# Patient Record
Sex: Female | Born: 1980
Health system: Southern US, Community
[De-identification: ages and names within clinical notes are randomized; demographics above are authoritative.]

## PROBLEM LIST (undated history)

## (undated) HISTORY — PX: GUM SURGERY: SHX658

## (undated) HISTORY — PX: WISDOM TOOTH EXTRACTION: SHX21

---

## 2018-07-13 ENCOUNTER — Ambulatory Visit: Payer: Self-pay

## 2018-07-26 ENCOUNTER — Ambulatory Visit: Payer: Self-pay | Admitting: Adult Health

## 2018-07-26 ENCOUNTER — Encounter: Payer: Self-pay | Admitting: Adult Health

## 2018-07-26 VITALS — BP 116/76 | HR 82 | Temp 98.6°F | Resp 16 | Ht 61.0 in | Wt 146.0 lb

## 2018-07-26 DIAGNOSIS — R82998 Other abnormal findings in urine: Secondary | ICD-10-CM

## 2018-07-26 DIAGNOSIS — R399 Unspecified symptoms and signs involving the genitourinary system: Secondary | ICD-10-CM

## 2018-07-26 LAB — POCT URINALYSIS DIPSTICK
APPEARANCE: NORMAL
Bilirubin, UA: NEGATIVE
GLUCOSE UA: NEGATIVE
Ketones, UA: NEGATIVE
NITRITE UA: NEGATIVE
PROTEIN UA: NEGATIVE
Spec Grav, UA: 1.025 (ref 1.010–1.025)
Urobilinogen, UA: 1 E.U./dL
pH, UA: 7 (ref 5.0–8.0)

## 2018-07-26 MED ORDER — SULFAMETHOXAZOLE-TRIMETHOPRIM 800-160 MG PO TABS: 1.0000 | ORAL_TABLET | Freq: Two times a day (BID) | ORAL | 0 refills | Status: DC

## 2018-07-26 NOTE — Progress Notes (Signed)
Sent for urine culture.  Orders Placed This Encounter     sulfamethoxazole-trimethoprim (BACTRIM DS,SEPTRA DS) 800-160 MG tablet         Sig: Take 1 tablet by mouth 2 (two) times daily.         Dispense:  20 tablet         Refill:  0 On antibiotics as above.

## 2018-07-26 NOTE — Progress Notes (Addendum)
Subjective:     Patient ID: April Hoffman, female   DOB: 26-Sep-1981, 37 y.o.   MRN: [id number]   Blood pressure 116/76, pulse 82, temperature 98.6 F (37 C), resp. rate 16, height 5\' 1"  (1.549 m), weight 146 lb (66.2 kg), last menstrual period 07/26/2018, SpO2 99 %. Patient's last menstrual period was 07/26/2018.   Patient is a 37 year old female in no acute distress who comes to the clinic for complaints of urinary symptoms. She is currently on her menstrual cycle now.   Urinary Tract Infection   This is a new problem. The current episode started in the past 7 days (3 days ago symptoms started ). The problem occurs intermittently. The problem has been gradually worsening. The quality of the pain is described as burning. Pain scale: " just burning "  The pain is mild. There has been no fever. Fever duration: denies any fever. She is sexually active (with husband - denies any concerns / denies any vaginal symptoms. ). There is no history of pyelonephritis. Associated symptoms include frequency and hesitancy. Pertinent negatives include no chills, discharge, flank pain, hematuria, nausea, possible pregnancy, sweats, urgency or vomiting. She has tried nothing for the symptoms. The treatment provided no relief. There is no history of catheterization, kidney stones, recurrent UTIs, a single kidney, urinary stasis or a urological procedure.    Allergies  Allergen Reactions  . Coconut Oil     All coconut    Denies chronic history of Urinary tract infections; last UTI was over three years ago.  Denies any new sexual partners or vaginal symptoms. Denies any concerns for sexually transmitted disease.   Denies any medical problems and denies any prescription or over the counter medications.   Patient  denies any fever, body aches,chills, rash, chest pain, shortness of breath, nausea, vomiting, or diarrhea.   Review of Systems  Constitutional: Negative.  Negative for chills.  HENT: Negative.    Respiratory: Negative.   Cardiovascular: Negative.   Gastrointestinal: Negative.  Negative for nausea and vomiting.  Genitourinary: Positive for dysuria, frequency and hesitancy. Negative for decreased urine volume, difficulty urinating, dyspareunia, enuresis, flank pain, genital sores, hematuria, menstrual problem, pelvic pain, urgency, vaginal bleeding, vaginal discharge and vaginal pain.  Musculoskeletal: Negative.   Skin: Negative.   Neurological: Negative.   Hematological: Negative.   Psychiatric/Behavioral: Negative.       Objective:   Physical Exam  Constitutional: She is oriented to person, place, and time. She appears well-developed and well-nourished. No distress.  HENT:  Head: Normocephalic and atraumatic.  Eyes: Pupils are equal, round, and reactive to light. EOM are normal.  Neck: Normal range of motion. Neck supple.  Cardiovascular: Normal rate, regular rhythm, normal heart sounds and intact distal pulses. Exam reveals no gallop and no friction rub.  No murmur heard. Pulmonary/Chest: Effort normal and breath sounds normal. No stridor. No respiratory distress. She has no wheezes. She has no rales. She exhibits no tenderness.  Abdominal: Soft. Normal appearance, normal aorta and bowel sounds are normal. She exhibits no distension and no mass. There is no tenderness. There is no rebound, no guarding and no CVA tenderness. No hernia.  Musculoskeletal: Normal range of motion.  Neurological: She is alert and oriented to person, place, and time.  Skin: Skin is warm and dry. Capillary refill takes less than 2 seconds. She is not diaphoretic.  Psychiatric: She has a normal mood and affect. Her behavior is normal. Judgment and thought content normal.   Results for  orders placed or performed in visit on 07/26/18 (from the past 72 hour(s))  POCT urinalysis dipstick     Status: Abnormal   Collection Time: 07/26/18  3:28 PM  Result Value Ref Range   Color, UA yellow    Clarity, UA  clear    Glucose, UA Negative Negative   Bilirubin, UA negative    Ketones, UA negative    Spec Grav, UA 1.025 1.010 - 1.025   Blood, UA large    pH, UA 7.0 5.0 - 8.0   Protein, UA Negative Negative   Urobilinogen, UA 1.0 0.2 or 1.0 E.U./dL   Nitrite, UA negtive    Leukocytes, UA Small (1+) (A) Negative   Appearance normal    Odor none       Assessment:     Urinary tract infection symptoms - Plan: POCT urinalysis dipstick, CULTURE, URINE COMPREHENSIVE  Leukocytes in urine - Plan: CULTURE, URINE COMPREHENSIVE     Plan:     Meds ordered this encounter  Medications  . sulfamethoxazole-trimethoprim (BACTRIM DS,SEPTRA DS) 800-160 MG tablet    Sig: Take 1 tablet by mouth 2 (two) times daily.    Dispense:  20 tablet    Refill:  0   Return to clinic 3 to 5 days after completing antibiotics  sooner if symptoms worsen or persist or if fever or any other symptoms develop.   Advised patient call the office or your primary care doctor for an appointment if no improvement within 72 hours or if any symptoms change or worsen at any time  Advised ER or urgent Care if after hours or on weekend. Call 911 for emergency symptoms at any time.Patinet verbalized understanding of all instructions given/reviewed and treatment plan and has no further questions or concerns at this time.    Patient verbalized understanding of all instructions given and denies any further questions at this time.

## 2018-07-26 NOTE — Patient Instructions (Signed)

## 2018-07-28 ENCOUNTER — Telehealth: Payer: Self-pay | Admitting: Adult Health

## 2018-07-28 MED ORDER — AMOXICILLIN-POT CLAVULANATE 875-125 MG PO TABS: 1.0000 | ORAL_TABLET | Freq: Two times a day (BID) | ORAL | 0 refills | Status: DC

## 2018-07-28 NOTE — Telephone Encounter (Signed)
0830 am patient called to reports she took her first dose of Bactrim last night and about 15 minutes after taking it she developed vomiting that lasted for two hours. Vomited x 6 times then resolved last pm 07/27/18. She denied any hoarseness, throat swelling, rash or any lasting effects today. She still has urinary symptoms. She denies any new symptoms since office visit.  She reports she is urinating normally.  She is aware to have liiquid Benadryl on hand and take 50 mg if needed for allergy response.  Discussed signs  of allergy,  She reports she is doing fine and no symptoms this morning.  Will discontinue Bactrim and add to allergy list.   Medications Discontinued During This Encounter  Medication Reason  . sulfamethoxazole-trimethoprim (BACTRIM DS,SEPTRA DS) 800-160 MG tablet Allergic reaction   Meds ordered this encounter  Medications  . amoxicillin-clavulanate (AUGMENTIN) 875-125 MG tablet    Sig: Take 1 tablet by mouth 2 (two) times daily. Take with food- discontinue and urgent care/ER if any reaction    Dispense:  20 tablet    Refill:  0    Sulfur caused vomiting add to allergies She denies any penicillin allergies   Advised patient call the office or your primary care doctor for an appointment if no improvement within 72 hours or if any symptoms change or worsen at any time  Advised ER or urgent Care if after hours or on weekend. Call 911 for emergency symptoms at any time.Patinet verbalized understanding of all instructions given/reviewed and treatment plan and has no further questions or concerns at this time.    Patient verbalized understanding of all instructions given and denies any further questions at this time.

## 2018-07-29 LAB — CULTURE, URINE COMPREHENSIVE

## 2018-08-02 NOTE — Progress Notes (Signed)
French Ana please let her know that her urine culture showed non sufficient bacteria growth. If she has persistent symptoms she should follow up with her primary care MD.

## 2018-11-07 DIAGNOSIS — Z01419 Encounter for gynecological examination (general) (routine) without abnormal findings: Secondary | ICD-10-CM | POA: Diagnosis not present

## 2018-11-07 DIAGNOSIS — Z113 Encounter for screening for infections with a predominantly sexual mode of transmission: Secondary | ICD-10-CM | POA: Diagnosis not present

## 2018-11-07 DIAGNOSIS — Z01818 Encounter for other preprocedural examination: Secondary | ICD-10-CM | POA: Diagnosis not present

## 2018-11-20 DIAGNOSIS — Z01818 Encounter for other preprocedural examination: Secondary | ICD-10-CM | POA: Diagnosis not present

## 2018-11-20 DIAGNOSIS — R102 Pelvic and perineal pain: Secondary | ICD-10-CM | POA: Diagnosis not present

## 2018-11-20 DIAGNOSIS — Z30432 Encounter for removal of intrauterine contraceptive device: Secondary | ICD-10-CM | POA: Diagnosis not present

## 2018-11-20 DIAGNOSIS — Z113 Encounter for screening for infections with a predominantly sexual mode of transmission: Secondary | ICD-10-CM | POA: Diagnosis not present

## 2018-11-20 DIAGNOSIS — Z30017 Encounter for initial prescription of implantable subdermal contraceptive: Secondary | ICD-10-CM | POA: Diagnosis not present

## 2018-11-20 DIAGNOSIS — Z30430 Encounter for insertion of intrauterine contraceptive device: Secondary | ICD-10-CM | POA: Diagnosis not present

## 2018-12-04 ENCOUNTER — Ambulatory Visit: Payer: Self-pay | Admitting: Medical

## 2018-12-04 ENCOUNTER — Encounter: Payer: Self-pay | Admitting: Medical

## 2018-12-04 VITALS — BP 127/73 | HR 98 | Temp 99.6°F | Resp 18 | Wt 143.4 lb

## 2018-12-04 DIAGNOSIS — J0110 Acute frontal sinusitis, unspecified: Secondary | ICD-10-CM

## 2018-12-04 MED ORDER — AMOXICILLIN 875 MG PO TABS: 875.0000 mg | ORAL_TABLET | Freq: Two times a day (BID) | ORAL | 0 refills | Status: DC

## 2018-12-04 NOTE — Patient Instructions (Signed)

## 2018-12-04 NOTE — Progress Notes (Signed)
   Subjective:    Patient ID: April Hoffman, female    DOB: June 30, 1981, 38 y.o.   MRN: [id number]  HPI 38 yo female in non acute distress. Started last Wednesday with nasal discharge, and congestion. Denies fever or chills. Denies shortness of breath or chest pain. Ear pain left more than right. And pressure behind eyes, and with a sore throat.  Blood pressure 127/73, pulse 98, temperature 99.6 F (37.6 C), temperature source Tympanic, resp. rate 18, weight 143 lb 6.4 oz (65 kg), last menstrual period 11/08/2018, SpO2 98 %. Allergies  Allergen Reactions  . Coconut Oil     All coconut   . Sulfur     Vomiting after first dose      Review of Systems  Constitutional: Positive for appetite change (decreased). Negative for chills and fever.  HENT: Positive for congestion, ear pain (left > right), postnasal drip, rhinorrhea, sinus pressure (behind eyes, and forehead), sinus pain and sore throat. Negative for sneezing.   Eyes: Negative for discharge and itching.  Respiratory: Positive for cough. Negative for shortness of breath and wheezing.   Cardiovascular: Negative for chest pain.  Gastrointestinal: Positive for nausea. Negative for abdominal pain and diarrhea (nothing know).  Endocrine: Negative for polydipsia, polyphagia and polyuria.  Genitourinary: Negative for dysuria.  Musculoskeletal: Negative for myalgias.  Allergic/Immunologic: Positive for environmental allergies and food allergies (coconut).  Neurological: Positive for headaches.  Hematological: Positive for adenopathy (right axillary).  Psychiatric/Behavioral: Negative for behavioral problems, confusion, self-injury and suicidal ideas.       Objective:   Physical Exam Vitals signs and nursing note reviewed.  Constitutional:      Appearance: Normal appearance. She is normal weight.  HENT:     Head: Normocephalic and atraumatic.     Nose: Congestion present.     Mouth/Throat:     Mouth: Mucous membranes are moist.     Pharynx: Oropharynx is clear.  Eyes:     Extraocular Movements: Extraocular movements intact.     Pupils: Pupils are equal, round, and reactive to light.  Neurological:     Mental Status: She is alert.           Assessment & Plan:  Sinusitis  Meds ordered this encounter  Medications  . amoxicillin (AMOXIL) 875 MG tablet    Sig: Take 1 tablet (875 mg total) by mouth 2 (two) times daily.    Dispense:  20 tablet    Refill:  0  Rest , increase fluids, OTC Motrin or Tylenol for fever or pain.  Return in 3-5 days if not improving. Patient verbalizes understanding and has no questions at discharge.

## 2019-05-31 DIAGNOSIS — I959 Hypotension, unspecified: Secondary | ICD-10-CM | POA: Diagnosis not present

## 2019-05-31 DIAGNOSIS — R5383 Other fatigue: Secondary | ICD-10-CM | POA: Diagnosis not present

## 2019-05-31 DIAGNOSIS — E785 Hyperlipidemia, unspecified: Secondary | ICD-10-CM | POA: Diagnosis not present

## 2019-05-31 DIAGNOSIS — F419 Anxiety disorder, unspecified: Secondary | ICD-10-CM | POA: Diagnosis not present

## 2019-06-03 DIAGNOSIS — F411 Generalized anxiety disorder: Secondary | ICD-10-CM | POA: Diagnosis not present

## 2019-06-29 DIAGNOSIS — F419 Anxiety disorder, unspecified: Secondary | ICD-10-CM | POA: Diagnosis not present

## 2019-07-02 ENCOUNTER — Other Ambulatory Visit: Payer: Self-pay

## 2019-07-02 DIAGNOSIS — R5383 Other fatigue: Secondary | ICD-10-CM

## 2019-07-03 ENCOUNTER — Other Ambulatory Visit: Payer: Self-pay

## 2019-07-03 DIAGNOSIS — Z20822 Contact with and (suspected) exposure to covid-19: Secondary | ICD-10-CM

## 2019-07-03 DIAGNOSIS — R6889 Other general symptoms and signs: Secondary | ICD-10-CM | POA: Diagnosis not present

## 2019-07-04 ENCOUNTER — Ambulatory Visit: Payer: Self-pay

## 2019-07-04 LAB — NOVEL CORONAVIRUS, NAA: SARS-CoV-2, NAA: NOT DETECTED

## 2019-07-10 ENCOUNTER — Other Ambulatory Visit: Payer: Self-pay

## 2019-07-10 DIAGNOSIS — R5383 Other fatigue: Secondary | ICD-10-CM

## 2019-07-11 LAB — CBC
Hematocrit: 44.7 % (ref 34.0–46.6)
Hemoglobin: 15.2 g/dL (ref 11.1–15.9)
MCH: 31.4 pg (ref 26.6–33.0)
MCHC: 34 g/dL (ref 31.5–35.7)
MCV: 92 fL (ref 79–97)
Platelets: 292 10*3/uL (ref 150–450)
RBC: 4.84 x10E6/uL (ref 3.77–5.28)
RDW: 12.4 % (ref 11.7–15.4)
WBC: 6.8 10*3/uL (ref 3.4–10.8)

## 2019-07-11 LAB — COMPREHENSIVE METABOLIC PANEL
ALT: 13 IU/L (ref 0–32)
AST: 18 IU/L (ref 0–40)
Albumin/Globulin Ratio: 2 (ref 1.2–2.2)
Albumin: 4.8 g/dL (ref 3.8–4.8)
Alkaline Phosphatase: 64 IU/L (ref 39–117)
BUN/Creatinine Ratio: 8 — ABNORMAL LOW (ref 9–23)
BUN: 7 mg/dL (ref 6–20)
Bilirubin Total: 0.6 mg/dL (ref 0.0–1.2)
CO2: 20 mmol/L (ref 20–29)
Calcium: 9.2 mg/dL (ref 8.7–10.2)
Chloride: 105 mmol/L (ref 96–106)
Creatinine, Ser: 0.89 mg/dL (ref 0.57–1.00)
GFR calc Af Amer: 96 mL/min/{1.73_m2} (ref >59)
GFR calc non Af Amer: 83 mL/min/{1.73_m2} (ref >59)
Globulin, Total: 2.4 g/dL (ref 1.5–4.5)
Glucose: 86 mg/dL (ref 65–99)
Potassium: 4.3 mmol/L (ref 3.5–5.2)
Sodium: 139 mmol/L (ref 134–144)
Total Protein: 7.2 g/dL (ref 6.0–8.5)

## 2019-07-11 LAB — TSH: TSH: 1.63 u[IU]/mL (ref 0.450–4.500)

## 2019-07-13 DIAGNOSIS — Z0000 Encounter for general adult medical examination without abnormal findings: Secondary | ICD-10-CM | POA: Diagnosis not present

## 2019-08-03 DIAGNOSIS — F419 Anxiety disorder, unspecified: Secondary | ICD-10-CM | POA: Diagnosis not present

## 2019-08-21 ENCOUNTER — Other Ambulatory Visit: Payer: Self-pay

## 2019-08-21 ENCOUNTER — Ambulatory Visit (INDEPENDENT_AMBULATORY_CARE_PROVIDER_SITE_OTHER): Payer: BC Managed Care – PPO | Admitting: Psychiatry

## 2019-08-21 ENCOUNTER — Encounter: Payer: Self-pay | Admitting: Psychiatry

## 2019-08-21 DIAGNOSIS — F320 Major depressive disorder, single episode, mild: Secondary | ICD-10-CM

## 2019-08-21 NOTE — Progress Notes (Signed)
Patient at the visit stated she was not interested in medication management and was looking for a psychotherapist.

## 2019-08-22 ENCOUNTER — Ambulatory Visit (HOSPITAL_COMMUNITY): Payer: BC Managed Care – PPO | Admitting: Licensed Clinical Social Worker

## 2019-08-22 DIAGNOSIS — F320 Major depressive disorder, single episode, mild: Secondary | ICD-10-CM | POA: Insufficient documentation

## 2019-08-31 DIAGNOSIS — K219 Gastro-esophageal reflux disease without esophagitis: Secondary | ICD-10-CM | POA: Diagnosis not present

## 2019-08-31 DIAGNOSIS — F419 Anxiety disorder, unspecified: Secondary | ICD-10-CM | POA: Diagnosis not present

## 2020-01-04 ENCOUNTER — Ambulatory Visit: Payer: Self-pay

## 2020-03-07 ENCOUNTER — Other Ambulatory Visit: Payer: Self-pay | Admitting: *Deleted

## 2020-03-07 MED ORDER — HYDROXYZINE HCL 25 MG PO TABS: 25.0000 mg | ORAL_TABLET | Freq: Three times a day (TID) | ORAL | 1 refills | Status: DC

## 2020-03-11 ENCOUNTER — Ambulatory Visit: Payer: BC Managed Care – PPO

## 2020-03-11 ENCOUNTER — Other Ambulatory Visit: Payer: Self-pay

## 2020-03-11 NOTE — Progress Notes (Signed)
Nutrition Consult 03/11/20  CC: Having difficulty continuing to lose weight using her current regimen.  HX: HT: 5'1'' WT: 138 lbs. Gives hx of weight gain to 145 lb during the COVID -19 lock down.  Notes that she feels most comfortable at 128 lbs. She placed herself on a 1200 calorie diet and lost to 139 lbs.  It has taken her 4 weeks to lose 1 lb. She is maintaining her diet and is frustrated with the lack of progress. Follows a Lacto-Ovo vegetarian diet pattern.  Keeping a dietary journal.  24 hr recall:   7:30 am Breakfast: Special K with Almond Milk 150 cal.  Coffee (1-2 cups with 1/4c. 2% milk and Stevia. 60 cal.  12:00 lunch:  Annie's Lentil/vegetable soup and drinks water. 270 cal.  Snack afternoon: apple or banana or both    160-200 calories.  5:30 Dinner: Often vegetable and bean soup with water to drink. 270-300 cal.  Snacks at night: Skinny popcorn with/or without snap pea snacks. Suanne Minahan also have piece of fruit to get to the 1200 calories for the day. Always has a herbal tea with Stevia.  Exercise/Activity: Gardens for 4 hours on weekend.  Rawleigh Rode go for hike on weekend.  Walking and yoga are limited/rare.  Works full [employer].  Job includes increased stress.  Recommendations: Add another 150 to 200 calories into the diet for next 3-4 weeks.Increase the calories at lunch time.  Try using a leafy green salad with healthy fat dressing. Add some cheese such as part-skim milk moaarella cheese or other low fat soft cheese or protein/fat in form of roasted nuts.  Try to incorporate physical activity into your daily routine in form of short yoga routine or a short brisk walk on as many days as possible each week.  Current goal and long term goal: To get my weight down to or close to 128 lbs.  Maintain my health with a good energy level.    Plan:  Try increasing my  calories toby 150 -200 per day.Walk on campus daily.   Call [facility] and arrange for a 15 -30 minute phone call for a  weight check in June.  Maggie Anajulia Leyendecker, RN, RD, LDN

## 2020-03-14 DIAGNOSIS — D229 Melanocytic nevi, unspecified: Secondary | ICD-10-CM | POA: Diagnosis not present

## 2020-03-14 DIAGNOSIS — L301 Dyshidrosis [pompholyx]: Secondary | ICD-10-CM | POA: Diagnosis not present

## 2020-03-14 DIAGNOSIS — L719 Rosacea, unspecified: Secondary | ICD-10-CM | POA: Diagnosis not present

## 2020-05-24 DIAGNOSIS — Z20822 Contact with and (suspected) exposure to covid-19: Secondary | ICD-10-CM | POA: Diagnosis not present

## 2020-07-17 ENCOUNTER — Other Ambulatory Visit: Payer: Self-pay

## 2020-07-17 ENCOUNTER — Telehealth: Payer: BC Managed Care – PPO | Admitting: Nurse Practitioner

## 2020-07-17 ENCOUNTER — Ambulatory Visit: Payer: BC Managed Care – PPO

## 2020-07-17 DIAGNOSIS — R059 Cough, unspecified: Secondary | ICD-10-CM

## 2020-07-17 DIAGNOSIS — Z20822 Contact with and (suspected) exposure to covid-19: Secondary | ICD-10-CM

## 2020-07-17 LAB — POC COVID19 BINAXNOW: SARS Coronavirus 2 Ag: NEGATIVE

## 2020-07-17 NOTE — Progress Notes (Signed)
   Subjective:    Patient ID: April Hoffman, female    DOB: 11-Jul-1981, 39 y.o.   MRN: [id number]  HPI 39 year old female that has been feeling run down for a week and started coughing yesterday, denies a known exposure to COVID. Denies a known fever, she was fully vaccinated in April for COVID.   She had asthma as a child and has grown out of that and has not used an inhaler since childhood.   She has felt some pressure with her cough, also noted she has been very stressed and burned out with everything going on right now.   She has not taken anything OTC as of now does take some Fish oil daily.    Review of Systems  Constitutional: Negative.   Respiratory: Positive for cough.   Psychiatric/Behavioral: The patient is nervous/anxious.        Objective:   Physical Exam        Assessment & Plan:  This was a telehealth appointment with the patient after a Rapid Covid was performed in the parking lot by RN. Rapid COVID was negative. Will send PCR for confirmation  Discussed stress management with patient encouraged counseling through [facility]  Recommended daily Vitamin C and D while ill  Will follow up with PCR RTC as needed if symptoms persist or with new concerns   May use Mucinex for cough support

## 2020-07-19 LAB — NOVEL CORONAVIRUS, NAA: SARS-CoV-2, NAA: NOT DETECTED

## 2020-07-19 LAB — SARS-COV-2, NAA 2 DAY TAT

## 2020-09-16 ENCOUNTER — Other Ambulatory Visit: Payer: Self-pay

## 2020-09-16 ENCOUNTER — Ambulatory Visit: Payer: BC Managed Care – PPO

## 2020-09-16 ENCOUNTER — Telehealth: Payer: BC Managed Care – PPO | Admitting: Registered Nurse

## 2020-09-16 ENCOUNTER — Telehealth: Payer: Self-pay | Admitting: Registered Nurse

## 2020-09-16 ENCOUNTER — Encounter: Payer: Self-pay | Admitting: Registered Nurse

## 2020-09-16 DIAGNOSIS — N390 Urinary tract infection, site not specified: Secondary | ICD-10-CM

## 2020-09-16 DIAGNOSIS — R350 Frequency of micturition: Secondary | ICD-10-CM

## 2020-09-16 LAB — POCT URINALYSIS DIPSTICK
Blood, UA: NEGATIVE
Glucose, UA: NEGATIVE
Ketones, UA: NEGATIVE
Leukocytes, UA: NEGATIVE
Nitrite, UA: NEGATIVE
Protein, UA: NEGATIVE
Spec Grav, UA: 1.02 (ref 1.010–1.025)
Urobilinogen, UA: NEGATIVE E.U./dL — AB
pH, UA: 6 (ref 5.0–8.0)

## 2020-09-16 NOTE — Progress Notes (Signed)
Pt presents with c/o feeling like she had a UTI; c/o  Pain with urination and cloudy urine; c/o left sided mid back pain today;  denies any blood; states she always has frequency and urgency; states she has a h/o UTI's; wt: 147 pounds; allergic to Sulfa drugs

## 2020-09-16 NOTE — Telephone Encounter (Signed)
Handouts sent to patient mychart account s/p telehealth visit

## 2020-09-16 NOTE — Progress Notes (Signed)
Subjective:    Patient ID: April Hoffman, female    DOB: [birth year], 39 y.o.   MRN: [id number]  39y/o established female patient presents with c/o feeling like she had a UTI; c/o  Pain with urination and cloudy urine; c/o left sided mid back pain today when first woke up then resolved as day went on and she had po intake;  denies any blood; states she always has frequency and urgency; states she has a h/o UTI's; wt: 147 pounds; allergic to Sulfa drugs did urinalysis with RN Romeo Apple in clinic this morning.  Patient consented to telephone visit.  This visit was conducted entirely via telephone/audio only.  I spent 19 minutes on the telephone with patient.  Patient reported was told years ago she has IBS.  Patient reports diarrhea typically starts after stressors/anxiety.  Patient has not tried fodmap diet.  Typically daily episode of diarrhea.  Patient reported at work doesn't typically drink a lot of water sips two 16oz coffees during her work shift.  Patient reported voiding every 30 minutes to hour typically for frequency.  Sometimes am void cloudy but denied smell or color changes typically darker yellow.       Review of Systems  Constitutional: Negative for activity change, appetite change, chills, diaphoresis, fatigue and fever.  HENT: Negative for trouble swallowing and voice change.   Eyes: Negative for photophobia and visual disturbance.  Respiratory: Negative for cough, shortness of breath, wheezing and stridor.   Cardiovascular: Negative for leg swelling.  Gastrointestinal: Positive for diarrhea. Negative for nausea and vomiting.  Endocrine: Negative for cold intolerance and heat intolerance.  Genitourinary: Positive for frequency and urgency. Negative for difficulty urinating and menstrual problem.  Musculoskeletal: Negative for arthralgias, back pain, gait problem and myalgias.  Skin: Negative for rash.  Allergic/Immunologic: Positive for environmental allergies and food allergies.   Neurological: Negative for dizziness, tremors, syncope, weakness, light-headedness and headaches.  Hematological: Negative for adenopathy. Does not bruise/bleed easily.  Psychiatric/Behavioral: Negative for agitation, confusion and sleep disturbance.       Objective:   Physical Exam Nursing note reviewed.  Constitutional:      General: She is awake. She is not in acute distress. HENT:     Right Ear: Hearing normal.     Left Ear: Hearing normal.     Nose: Nose normal. No congestion or rhinorrhea.     Mouth/Throat:     Pharynx: Oropharynx is clear.  Pulmonary:     Effort: Pulmonary effort is normal.     Breath sounds: Normal breath sounds and air entry. No wheezing.  Neurological:     Mental Status: She is alert and oriented to person, place, and time. Mental status is at baseline.     GCS: GCS verbal subscore is 5.  Psychiatric:        Attention and Perception: Attention and perception normal.        Mood and Affect: Mood normal.        Speech: Speech normal.        Behavior: Behavior normal. Behavior is cooperative.        Thought Content: Thought content normal.        Cognition and Memory: Cognition and memory normal.        Judgment: Judgment normal.       Discussed urinalysis results with patient and pending urine culture results will call or send my chart message once available typically 24-72hours.  Patient verbalized understanding information/instructions, agreed with plan of care  and had no further questions at this time.    Assessment & Plan:  A-urinary frequency, diarrhea  P-.Discussed hormonal changes thin tissues as age mechanical friction/wiping/clothes rubbing/sexual intercourse could exacerbate urinary symptoms.  Urine not dilute today increase water/noncaffeinated fluid intake.  Consider 1 cup per hour x 8 hours per day to keep urine pale yellow and clear.  Medications as directed. Patient is also to push fluids and may use Pyridium (Azo) OTC 50mg  po prn per  manufacturer instructions x 3 days.  Urine culture sent to [facility] today typically 24-72 hours for results.   Hydrate, avoid dehydration. Avoid holding urine void on frequent basis every 4 to 6 hours. If unable to void every 8 hours follow up for re-evaluation with PCM, urgent care or ER. Call or return to clinic as needed if these symptoms worsen or fail to improve as anticipated. Exitcare handout on urinary frequency, interstitial cystitis and foods that can irritate bladder from [facility] sent to patient mychart account.  Decrease caffeine intake (currently 32oz consider making weaker brew or cutting back other intake if cannot tolerate weaker formula).  Discussed no infection seen on urinalysis dipstick at [facility] today.    Patient verbalized agreement and understanding of treatment plan and had no further questions at this time.  P2: Hydrate and cranberry juice   Discussed fodmap diet.  Exitcare handouts on IBS/IBS diet posted to patient mychart and to follow up with PCM consider GI appt.  Consider keeping food and symptom log.  Patient verbalized understanding information/instructions, agreed with plan of care and had no further questions at this time.

## 2020-09-16 NOTE — Patient Instructions (Addendum)
Consider azo (pyridium) 50mg  by mouth over the counter per manufacturer instructions (Max 100mg  three times a day for 3 days) Will change color or urine to orange/bright gold and urine can stain clothes if splashes Will call with urine culture results once available typically 2-3 days ER/Urgent Care same day if unable to void every 8 hours, seeing red blood or clots in pee and not on menstrual cycle or brown/tea/coffee colored urine even if increase water intake, worsening flank/abdomen pain, fever or chills.   Do certain foods irritate the bladder? The bladder collects waste, including remainders of foods and drinks. If you have a bladder condition, such as IC, a variety of foods can irritate your bladder. Both common and unusual foods may cause irritation:  All alcoholic beverages, including champagne. Apples. Apple juice. Bananas. Beer. Brewer's yeast. Canned figs. Cantaloupes. Carbonated drinks. Cheese. Chicken livers. Chilies/spicy foods. Chocolate. Citrus fruits. Coffee. Corned beef. Cranberries. Fava beans. Grapes. Guava. Lemon juice. Lima beans. Nuts -- hazelnuts (also called filberts), pecans and pistachios Mayonnaise. NutraSweet.T Onions (raw). Peaches. Pickled herring. Pineapple. Plums. Prunes. Raisins. Rye bread. Saccharin. Sour cream. Soy sauce. Strawberries. Tea -- black or green, regular or decaffeinated, and herbal blends that contain black or green tea. Tomatoes. Vinegar. Vitamins buffered with aspartame. Yogurt. How are bladder-irritating foods identified? Determining if a food irritates your bladder is a process of elimination. Not all people sensitive to bladder irritants are affected by the same foods. Your healthcare provider can help you identify bladder-irritating foods.  To test bladder discomfort by eliminating foods, you can:  Keep a food diary to track foods that are and aren't irritating. Remove the foods listed above from your  diet for a few days. Once your symptoms are gone, you can begin to add foods in. Start with a small amount of one food, increasing the portion size over several days. If irritation returns after reintroducing a food, stop eating it completely. Repeat food reintroduction slowly to identify your bladder-irritating foods. Lab tests cannot diagnose foods that cause bladder irritation. But a urologist (healthcare specialist who treats urinary system problems) may examine your bladder to diagnose or rule out IC.  How can I manage bladder irritation? You can manage discomfort by avoiding foods you have identified as bladder irritants. But removing foods from your diet doesn't mean you can never have them again. You might be able to enjoy them in moderation (once in a while). Drinking plenty of water will help reduce pain from any bladder-irritating foods you might ingest, in moderation or accidentally.  Can I prevent bladder irritation from foods? You cannot always avoid bladder discomfort. But identifying foods that cause bladder pain can go a long way to helping you feel better. Through a process of elimination and careful diet, you can find and avoid bothersome foods and drinks.  What is the outlook for people sensitive to bladder irritation? If foods irritate your bladder, you may worry about finding enough to eat. SOME people with IC are able to eat and drink these foods:  Alcohol or wines (only as flavoring). Almonds. Apple juice. Blueberries. Coffee (acid-free kava) or highly roasted. Extracts (brandy, rum, etc.). Imitation sour cream. Lentils. Nuts -- almonds, cashews and peanuts. Onions (cooked). Orange juice (reduced acid). Pears. Processed cheese (non-aged). Shallots. Spring water. Strawberries (1/2 cup). Sun tea (herbal, but no blends made with black or green tea). Tomatoes (low acid). White chocolate. Wines (late harvest). Zest of orange or limes. Other foods not listed. (It  is best to  check with your provider.) What can I do if I have bladder pain from foods? Living with bladder irritation can be uncomfortable. But you can take steps to remove irritants from your diet and reduce pain. Avoid foods that irritate your bladder, and remember that water is important. Drinking enough water helps you feel more comfortable after you eat foods that irritate your bladder.   Interstitial Cystitis  Interstitial cystitis is inflammation of the bladder. This may cause pain in the bladder area as well as a frequent and urgent need to urinate. The bladder is a hollow organ in the lower part of the abdomen. It stores urine after the urine is made in the kidneys. The severity of interstitial cystitis can vary from person to person. You may have flare-ups, and then your symptoms may go away for a while. For many people, it becomes a long-term (chronic) problem. What are the causes? The cause of this condition is not known. What increases the risk? The following factors may make you more likely to develop this condition:  You are female.  You have fibromyalgia.  You have irritable bowel syndrome (IBS).  You have endometriosis. This condition may be aggravated by:  Stress.  Smoking.  Spicy foods. What are the signs or symptoms? Symptoms of interstitial cystitis vary, and they can change over time. Symptoms may include:  Discomfort or pain in the bladder area, which is in the lower abdomen. Pain can range from mild to severe. The pain may change in intensity as the bladder fills with urine or as it empties.  Pain in the pelvic area, between the hip bones.  An urgent need to urinate.  Frequent urination.  Pain during urination.  Pain during sex.  Blood in the urine. For women, symptoms often get worse during menstruation. How is this diagnosed? This condition is diagnosed based on your symptoms, your medical history, and a physical exam. You may have tests to rule  out other conditions, such as:  Urine tests.  Cystoscopy. For this test, a tool similar to a very thin telescope is used to look into your bladder.  Biopsy. This involves taking a sample of tissue from the bladder to be examined under a microscope. How is this treated? There is no cure for this condition, but treatment can help you control your symptoms. Work closely with your health care provider to find the most effective treatments for you. Treatment options may include:  Medicines to relieve pain and reduce how often you feel the need to urinate.  Learning ways to control when you urinate (bladder training).  Lifestyle changes, such as changing your diet or taking steps to control stress.  Using a device that provides electrical stimulation to your nerves, which can relieve pain (neuromodulation therapy). The device is placed on your back, where it blocks the nerves that cause you to feel pain in your bladder area.  A procedure that stretches your bladder by filling it with air or fluid.  Surgery. This is rare. It is only done for extreme cases, if other treatments do not help. Follow these instructions at home: Bladder training   Use bladder training techniques as directed. Techniques may include: ? Urinating at scheduled times. ? Training yourself to delay urination. ? Doing exercises (Kegel exercises) to strengthen the muscles that control urine flow.  Keep a bladder diary. ? Write down the times that you urinate and any symptoms that you have. This can help you find out which foods, liquids, or  activities make your symptoms worse. ? Use your bladder diary to schedule bathroom trips. If you are away from home, plan to be near a bathroom at each of your scheduled times.  Make sure that you urinate just before you leave the house and just before you go to bed. Eating and drinking  Make dietary changes as recommended by your health care provider. You may need to  avoid: ? Spicy foods. ? Foods that contain a lot of potassium.  Limit your intake of beverages that make you need to urinate. These include: ? Caffeinated beverages like soda, coffee, and tea. ? Alcohol. General instructions  Take over-the-counter and prescription medicines only as told by your health care provider.  Do not drink alcohol.  You can try a warm or cool compress over your bladder for comfort.  Avoid wearing tight clothing.  Do not use any products that contain nicotine or tobacco, such as cigarettes and e-cigarettes. If you need help quitting, ask your health care provider.  Keep all follow-up visits as told by your health care provider. This is important. Contact a health care provider if you have:  Symptoms that do not get better with treatment.  Pain or discomfort that gets worse.  More frequent urges to urinate.  A fever. Get help right away if:  You have no control over when you urinate. Summary  Interstitial cystitis is inflammation of the bladder.  This condition may cause pain in the bladder area as well as a frequent and urgent need to urinate.  You may have flare-ups of the condition, and then it may go away for a while. For many people, it becomes a long-term (chronic) problem.  There is no cure for interstitial cystitis, but treatment methods are available to control your symptoms. This information is not intended to replace advice given to you by your health care provider. Make sure you discuss any questions you have with your health care provider. Document Revised: 09/23/2017 Document Reviewed: 09/05/2017 Elsevier Patient Education  2020 [employer]   Low-FODMAP Eating Plan  FODMAPs (fermentable oligosaccharides, disaccharides, monosaccharides, and polyols) are sugars that are hard for some people to digest. A low-FODMAP eating plan may help some people who have bowel (intestinal) diseases to manage their symptoms. This meal plan can be  complicated to follow. Work with a diet and nutrition specialist (dietitian) to make a low-FODMAP eating plan that is right for you. A dietitian can make sure that you get enough nutrition from this diet. What are tips for following this plan? Reading food labels Check labels for hidden FODMAPs such as: High-fructose syrup. Honey. Agave. Natural fruit flavors. Onion or garlic powder. Choose low-FODMAP foods that contain 3-4 grams of fiber per serving. Check food labels for serving sizes. Eat only one serving at a time to make sure FODMAP levels stay low. Meal planning Follow a low-FODMAP eating plan for up to 6 weeks, or as told by your health care provider or dietitian. To follow the eating plan: Eliminate high-FODMAP foods from your diet completely. Gradually reintroduce high-FODMAP foods into your diet one at a time. Most people should wait a few days after introducing one high-FODMAP food before they introduce the next high-FODMAP food. Your dietitian can recommend how quickly you may reintroduce foods. Keep a daily record of what you eat and drink, and make note of any symptoms that you have after eating. Review your daily record with a dietitian regularly. Your dietitian can help you identify which foods you  can eat and which foods you should avoid. General tips Drink enough fluid each day to keep your urine pale yellow. Avoid processed foods. These often have added sugar and may be high in FODMAPs. Avoid most dairy products, whole grains, and sweeteners. Work with a dietitian to make sure you get enough fiber in your diet. Recommended foods Grains Gluten-free grains, such as rice, oats, buckwheat, quinoa, corn, polenta, and millet. Gluten-free pasta, bread, or cereal. Rice noodles. Corn tortillas. Vegetables Eggplant, zucchini, cucumber, peppers, green beans, Brussels sprouts, bean sprouts, lettuce, arugula, kale, Swiss chard, spinach, collard greens, bok choy, summer squash,  potato, and tomato. Limited amounts of corn, carrot, and sweet potato. Green parts of scallions. Fruits Bananas, oranges, lemons, limes, blueberries, raspberries, strawberries, grapes, cantaloupe, honeydew melon, kiwi, papaya, passion fruit, and pineapple. Limited amounts of dried cranberries, banana chips, and shredded coconut. Dairy Lactose-free milk, yogurt, and kefir. Lactose-free cottage cheese and ice cream. Non-dairy milks, such as almond, coconut, hemp, and rice milk. Yogurts made of non-dairy milks. Limited amounts of goat cheese, brie, mozzarella, parmesan, swiss, and other hard cheeses. Meats and other protein foods Unseasoned beef, pork, poultry, or fish. Eggs. Tomasa Blase. Tofu (firm) and tempeh. Limited amounts of nuts and seeds, such as almonds, walnuts, [place] nuts, pecans, peanuts, pumpkin seeds, chia seeds, and sunflower seeds. Fats and oils Butter-free spreads. Vegetable oils, such as olive, canola, and sunflower oil. Seasoning and other foods Artificial sweeteners with names that do not end in "ol" such as aspartame, saccharine, and stevia. Maple syrup, white table sugar, raw sugar, brown sugar, and molasses. Fresh basil, coriander, parsley, rosemary, and thyme. Beverages Water and mineral water. Sugar-sweetened soft drinks. Small amounts of orange juice or cranberry juice. Black and green tea. Most dry wines. Coffee. This may not be a complete list of low-FODMAP foods. Talk with your dietitian for more information. Foods to avoid Grains Wheat, including kamut, durum, and semolina. Barley and bulgur. Couscous. Wheat-based cereals. Wheat noodles, bread, crackers, and pastries. Vegetables Chicory root, artichoke, asparagus, cabbage, snow peas, sugar snap peas, mushrooms, and cauliflower. Onions, garlic, leeks, and the white part of scallions. Fruits Fresh, dried, and juiced forms of apple, pear, watermelon, peach, plum, cherries, apricots, blackberries, boysenberries, figs,  nectarines, and mango. Avocado. Dairy Milk, yogurt, ice cream, and soft cheese. Cream and sour cream. Milk-based sauces. Custard. Meats and other protein foods Fried or fatty meat. Sausage. Cashews and pistachios. Soybeans, baked beans, black beans, chickpeas, kidney beans, fava beans, navy beans, lentils, and split peas. Seasoning and other foods Any sugar-free gum or candy. Foods that contain artificial sweeteners such as sorbitol, mannitol, isomalt, or xylitol. Foods that contain honey, high-fructose corn syrup, or agave. Bouillon, vegetable stock, beef stock, and chicken stock. Garlic and onion powder. Condiments made with onion, such as hummus, chutney, pickles, relish, salad dressing, and salsa. Tomato paste. Beverages Chicory-based drinks. Coffee substitutes. Chamomile tea. Fennel tea. Sweet or fortified wines such as port or sherry. Diet soft drinks made with isomalt, mannitol, maltitol, sorbitol, or xylitol. Apple, pear, and mango juice. Juices with high-fructose corn syrup. This may not be a complete list of high-FODMAP foods. Talk with your dietitian to discuss what dietary choices are best for you.  Summary A low-FODMAP eating plan is a short-term diet that eliminates FODMAPs from your diet to help ease symptoms of certain bowel diseases. The eating plan usually lasts up to 6 weeks. After that, high-FODMAP foods are restarted gradually, one at a time, so you can find out which  may be causing symptoms. A low-FODMAP eating plan can be complicated. It is best to work with a dietitian who has experience with this type of plan. This information is not intended to replace advice given to you by your health care provider. Make sure you discuss any questions you have with your health care provider. Document Revised: 09/23/2017 Document Reviewed: 06/07/2017 Elsevier Patient Education  2020 [employer] Inc[employer] Diet for Irritable Bowel Syndrome When you have irritable bowel syndrome (IBS), it is very  important to eat the foods and follow the eating habits that are best for your condition. IBS may cause various symptoms such as pain in the abdomen, constipation, or diarrhea. Choosing the right foods can help to ease the discomfort from these symptoms. Work with your health care provider and diet and nutrition specialist (dietitian) to find the eating plan that will help to control your symptoms. What are tips for following this plan?      Keep a food diary. This will help you identify foods that cause symptoms. Write down: ? What you eat and when you eat it. ? What symptoms you have. ? When symptoms occur in relation to your meals, such as "pain in abdomen 2 hours after dinner."  Eat your meals slowly and in a relaxed setting.  Aim to eat 5-6 small meals per day. Do not skip meals.  Drink enough fluid to keep your urine pale yellow.  Ask your health care provider if you should take an over-the-counter probiotic to help restore healthy bacteria in your gut (digestive tract). ? Probiotics are foods that contain good bacteria and yeasts.  Your dietitian may have specific dietary recommendations for you based on your symptoms. He or she may recommend that you: ? Avoid foods that cause symptoms. Talk with your dietitian about other ways to get the same nutrients that are in those problem foods. ? Avoid foods with gluten. Gluten is a protein that is found in rye, wheat, and barley. ? Eat more foods that contain soluble fiber. Examples of foods with high soluble fiber include oats, seeds, and certain fruits and vegetables. Take a fiber supplement if directed by your dietitian. ? Reduce or avoid certain foods called FODMAPs. These are foods that contain carbohydrates that are hard to digest. Ask your doctor which foods contain these carbohydrates. What foods are not recommended? The following are some foods and drinks that may make your symptoms worse:  Fatty foods, such as french  fries.  Foods that contain gluten, such as pasta and cereal.  Dairy products, such as milk, cheese, and ice cream.  Chocolate.  Alcohol.  Products with caffeine, such as coffee.  Carbonated drinks, such as soda.  Foods that are high in FODMAPs. These include certain fruits and vegetables.  Products with sweeteners such as honey, high fructose corn syrup, sorbitol, and mannitol. The items listed above may not be a complete list of foods and beverages you should avoid. Contact a dietitian for more information. What foods are good sources of fiber? Your health care provider or dietitian may recommend that you eat more foods that contain fiber. Fiber can help to reduce constipation and other IBS symptoms. Add foods with fiber to your diet a little at a time so your body can get used to them. Too much fiber at one time might cause gas and swelling of your abdomen. The following are some foods that are good sources of fiber:  Berries, such as raspberries, strawberries, and blueberries.  Tomatoes.  Carrots.  Brown rice.  Oats.  Seeds, such as chia and pumpkin seeds. The items listed above may not be a complete list of recommended sources of fiber. Contact your dietitian for more options. Where to find more information  International Foundation for Functional Gastrointestinal Disorders: www.iffgd.[employer] of Diabetes and Digestive and Kidney Diseases: CarFli[employer].tn Summary  When you have irritable bowel syndrome (IBS), it is very important to eat the foods and follow the eating habits that are best for your condition.  IBS may cause various symptoms such as pain in the abdomen, constipation, or diarrhea.  Choosing the right foods can help to ease the discomfort that comes from symptoms.  Keep a food diary. This will help you identify foods that cause symptoms.  Your health care provider or diet and nutrition specialist (dietitian) may recommend that you eat  more foods that contain fiber. This information is not intended to replace advice given to you by your health care provider. Make sure you discuss any questions you have with your health care provider. Document Revised: 01/31/2019 Document Reviewed: 06/14/2017 Elsevier Patient Education  2020 Else[employer] Inc. Irritable Bowel Syndrome, Adult  Irritable bowel syndrome (IBS) is a group of symptoms that affects the organs responsible for digestion (gastrointestinal or GI tract). IBS is not one specific disease. To regulate how the GI tract works, the body sends signals back and forth between the intestines and the brain. If you have IBS, there may be a problem with these signals. As a result, the GI tract does not function normally. The intestines may become more sensitive and overreact to certain things. This may be especially true when you eat certain foods or when you are under stress. There are four types of IBS. These may be determined based on the consistency of your stool (feces):  IBS with diarrhea.  IBS with constipation.  Mixed IBS.  Unsubtyped IBS. It is important to know which type of IBS you have. Certain treatments are more likely to be helpful for certain types of IBS. What are the causes? The exact cause of IBS is not known. What increases the risk? You may have a higher risk for IBS if you:  Are female.  Are younger than 54.  Have a family history of IBS.  Have a mental health condition, such as depression, anxiety, or post-traumatic stress disorder.  Have had a bacterial infection of your GI tract. What are the signs or symptoms? Symptoms of IBS vary from person to person. The main symptom is abdominal pain or discomfort. Other symptoms usually include one or more of the following:  Diarrhea, constipation, or both.  Abdominal swelling or bloating.  Feeling full after eating a small or regular-sized meal.  Frequent gas.  Mucus in the stool.  A feeling of having  more stool left after a bowel movement. Symptoms tend to come and go. They may be triggered by stress, mental health conditions, or certain foods. How is this diagnosed? This condition may be diagnosed based on a physical exam, your medical history, and your symptoms. You may have tests, such as:  Blood tests.  Stool test.  X-rays.  CT scan.  Colonoscopy. This is a procedure in which your GI tract is viewed with a long, thin, flexible tube. How is this treated? There is no cure for IBS, but treatment can help relieve symptoms. Treatment depends on the type of IBS you have, and may include:  Changes to your diet, such as: ? Avoiding  foods that cause symptoms. ? Drinking more water. ? Following a low-FODMAP (fermentable oligosaccharides, disaccharides, monosaccharides, and polyols) diet for up to 6 weeks, or as told by your health care provider. FODMAPs are sugars that are hard for some people to digest. ? Eating more fiber. ? Eating medium-sized meals at the same times every day.  Medicines. These may include: ? Fiber supplements, if you have constipation. ? Medicine to control diarrhea (antidiarrheal medicines). ? Medicine to help control muscle tightening (spasms) in your GI tract (antispasmodic medicines). ? Medicines to help with mental health conditions, such as antidepressants or tranquilizers.  Talk therapy or counseling.  Working with a diet and nutrition specialist (dietitian) to help create a food plan that is right for you.  Managing your stress. Follow these instructions at home: Eating and drinking  Eat a healthy diet.  Eat medium-sized meals at about the same time every day. Do not eat large meals.  Gradually eat more fiber-rich foods. These include whole grains, fruits, and vegetables. This may be especially helpful if you have IBS with constipation.  Eat a diet low in FODMAPs.  Drink enough fluid to keep your urine pale yellow.  Keep a journal of foods  that seem to trigger symptoms.  Avoid foods and drinks that: ? Contain added sugar. ? Make your symptoms worse. Dairy products, caffeinated drinks, and carbonated drinks can make symptoms worse for some people. General instructions  Take over-the-counter and prescription medicines and supplements only as told by your health care provider.  Get enough exercise. Do at least 150 minutes of moderate-intensity exercise each week.  Manage your stress. Getting enough sleep and exercise can help you manage stress.  Keep all follow-up visits as told by your health care provider and therapist. This is important. Alcohol Use  Do not drink alcohol if: ? Your health care provider tells you not to drink. ? You are pregnant, may be pregnant, or are planning to become pregnant.  If you drink alcohol, limit how much you have: ? 0-1 drink a day for women. ? 0-2 drinks a day for men.  Be aware of how much alcohol is in your drink. In the [place] one drink equals one typical bottle of beer (12 oz), one-half glass of wine (5 oz), or one shot of hard liquor (1 oz). Contact a health care provider if you have:  Constant pain.  Weight loss.  Difficulty or pain when swallowing.  Diarrhea that gets worse. Get help right away if you have:  Severe abdominal pain.  Fever.  Diarrhea with symptoms of dehydration, such as dizziness or dry mouth.  Bright red blood in your stool.  Stool that is black and tarry.  Abdominal swelling.  Vomiting that does not stop.  Blood in your vomit. Summary  Irritable bowel syndrome (IBS) is not one specific disease. It is a group of symptoms that affects digestion.  Your intestines may become more sensitive and overreact to certain things. This may be especially true when you eat certain foods or when you are under stress.  There is no cure for IBS, but treatment can help relieve symptoms. This information is not intended to replace advice given to you by your  health care provider. Make sure you discuss any questions you have with your health care provider. Document Revised: 10/04/2017 Document Reviewed: 10/04/2017 Elsevier Patient Education  2020 [employer] Urinary Frequency, Adult Urinary frequency means urinating more often than usual. You may urinate every 1-2 hours even though  you drink a normal amount of fluid and do not have a bladder infection or condition. Although you urinate more often than normal, the total amount of urine produced in a day is normal. With urinary frequency, you may have an urgent need to urinate often. The stress and anxiety of needing to find a bathroom quickly can make this urge worse. This condition may go away on its own or you may need treatment at home. Home treatment may include bladder training, exercises, taking medicines, or making changes to your diet. Follow these instructions at home: Bladder health   Keep a bladder diary if told by your health care provider. Keep track of: ? What you eat and drink. ? How often you urinate. ? How much you urinate.  Follow a bladder training program if told by your health care provider. This may include: ? Learning to delay going to the bathroom. ? Double urinating (voiding). This helps if you are not completely emptying your bladder. ? Scheduled voiding.  Do Kegel exercises as told by your health care provider. Kegel exercises strengthen the muscles that help control urination, which may help the condition. Eating and drinking  If told by your health care provider, make diet changes, such as: ? Avoiding caffeine. ? Drinking fewer fluids, especially alcohol. ? Not drinking in the evening. ? Avoiding foods or drinks that may irritate the bladder. These include coffee, tea, soda, artificial sweeteners, citrus, tomato-based foods, and chocolate. ? Eating foods that help prevent or ease constipation. Constipation can make this condition worse. Your health care provider  may recommend that you:  Drink enough fluid to keep your urine pale yellow.  Take over-the-counter or prescription medicines.  Eat foods that are high in fiber, such as beans, whole grains, and fresh fruits and vegetables.  Limit foods that are high in fat and processed sugars, such as fried or sweet foods. General instructions  Take over-the-counter and prescription medicines only as told by your health care provider.  Keep all follow-up visits as told by your health care provider. This is important. Contact a health care provider if:  You start urinating more often.  You feel pain or irritation when you urinate.  You notice blood in your urine.  Your urine looks cloudy.  You develop a fever.  You begin vomiting. Get help right away if:  You are unable to urinate. Summary  Urinary frequency means urinating more often than usual. With urinary frequency, you may urinate every 1-2 hours even though you drink a normal amount of fluid and do not have a bladder infection or other bladder condition.  Your health care provider may recommend that you keep a bladder diary, follow a bladder training program, or make dietary changes.  If told by your health care provider, do Kegel exercises to strengthen the muscles that help control urination.  Take over-the-counter and prescription medicines only as told by your health care provider.  Contact a health care provider if your symptoms do not improve or get worse. This information is not intended to replace advice given to you by your health care provider. Make sure you discuss any questions you have with your health care provider. Document Revised: 04/20/2018 Document Reviewed: 04/20/2018 Elsevier Patient Education  2020 [employer].

## 2020-09-19 LAB — URINE CULTURE

## 2020-10-07 DIAGNOSIS — Z1331 Encounter for screening for depression: Secondary | ICD-10-CM | POA: Diagnosis not present

## 2020-10-07 DIAGNOSIS — Z1151 Encounter for screening for human papillomavirus (HPV): Secondary | ICD-10-CM | POA: Diagnosis not present

## 2020-10-07 DIAGNOSIS — E559 Vitamin D deficiency, unspecified: Secondary | ICD-10-CM | POA: Diagnosis not present

## 2020-10-07 DIAGNOSIS — Z124 Encounter for screening for malignant neoplasm of cervix: Secondary | ICD-10-CM | POA: Diagnosis not present

## 2020-10-07 DIAGNOSIS — Z13220 Encounter for screening for lipoid disorders: Secondary | ICD-10-CM | POA: Diagnosis not present

## 2020-10-07 DIAGNOSIS — Z01419 Encounter for gynecological examination (general) (routine) without abnormal findings: Secondary | ICD-10-CM | POA: Diagnosis not present

## 2020-10-07 DIAGNOSIS — R5383 Other fatigue: Secondary | ICD-10-CM | POA: Diagnosis not present

## 2020-10-07 DIAGNOSIS — Z0000 Encounter for general adult medical examination without abnormal findings: Secondary | ICD-10-CM | POA: Diagnosis not present

## 2021-01-27 ENCOUNTER — Emergency Department
Admission: EM | Admit: 2021-01-27 | Discharge: 2021-01-27 | Disposition: A | Discharge status: Home / Self Care | Payer: BC Managed Care – PPO | Attending: Emergency Medicine | Admitting: Emergency Medicine

## 2021-01-27 ENCOUNTER — Other Ambulatory Visit: Payer: Self-pay

## 2021-01-27 DIAGNOSIS — R0789 Other chest pain: Secondary | ICD-10-CM | POA: Diagnosis not present

## 2021-01-27 DIAGNOSIS — R300 Dysuria: Secondary | ICD-10-CM | POA: Diagnosis not present

## 2021-01-27 DIAGNOSIS — N3000 Acute cystitis without hematuria: Secondary | ICD-10-CM | POA: Diagnosis not present

## 2021-01-27 DIAGNOSIS — R079 Chest pain, unspecified: Secondary | ICD-10-CM | POA: Diagnosis not present

## 2021-01-27 DIAGNOSIS — R002 Palpitations: Secondary | ICD-10-CM

## 2021-01-27 DIAGNOSIS — R5381 Other malaise: Secondary | ICD-10-CM | POA: Diagnosis not present

## 2021-01-27 DIAGNOSIS — R42 Dizziness and giddiness: Secondary | ICD-10-CM | POA: Insufficient documentation

## 2021-01-27 DIAGNOSIS — Z3202 Encounter for pregnancy test, result negative: Secondary | ICD-10-CM | POA: Diagnosis not present

## 2021-01-27 DIAGNOSIS — N390 Urinary tract infection, site not specified: Secondary | ICD-10-CM | POA: Diagnosis not present

## 2021-01-27 DIAGNOSIS — R140 Abdominal distension (gaseous): Secondary | ICD-10-CM | POA: Insufficient documentation

## 2021-01-27 DIAGNOSIS — R9431 Abnormal electrocardiogram [ECG] [EKG]: Secondary | ICD-10-CM | POA: Diagnosis not present

## 2021-01-27 DIAGNOSIS — R0602 Shortness of breath: Secondary | ICD-10-CM | POA: Insufficient documentation

## 2021-01-27 LAB — URINALYSIS, COMPLETE (UACMP) WITH MICROSCOPIC
Bilirubin Urine: NEGATIVE
Glucose, UA: NEGATIVE mg/dL
Hgb urine dipstick: NEGATIVE
Ketones, ur: NEGATIVE mg/dL
Leukocytes,Ua: NEGATIVE
Nitrite: NEGATIVE
Protein, ur: NEGATIVE mg/dL
Specific Gravity, Urine: 1.003 — ABNORMAL LOW (ref 1.005–1.030)
pH: 7 (ref 5.0–8.0)

## 2021-01-27 LAB — HEPATIC FUNCTION PANEL
ALT: 23 U/L (ref 0–44)
AST: 22 U/L (ref 15–41)
Albumin: 4.4 g/dL (ref 3.5–5.0)
Alkaline Phosphatase: 50 U/L (ref 38–126)
Bilirubin, Direct: 0.1 mg/dL (ref 0.0–0.2)
Indirect Bilirubin: 0.6 mg/dL (ref 0.3–0.9)
Total Bilirubin: 0.7 mg/dL (ref 0.3–1.2)
Total Protein: 7.1 g/dL (ref 6.5–8.1)

## 2021-01-27 LAB — BASIC METABOLIC PANEL
Anion gap: 8 (ref 5–15)
BUN: 10 mg/dL (ref 6–20)
CO2: 24 mmol/L (ref 22–32)
Calcium: 9.5 mg/dL (ref 8.9–10.3)
Chloride: 106 mmol/L (ref 98–111)
Creatinine, Ser: 0.88 mg/dL (ref 0.44–1.00)
GFR, Estimated: >60 mL/min (ref >60)
Glucose, Bld: 98 mg/dL (ref 70–99)
Potassium: 4.4 mmol/L (ref 3.5–5.1)
Sodium: 138 mmol/L (ref 135–145)

## 2021-01-27 LAB — CBC
HCT: 43.9 % (ref 36.0–46.0)
Hemoglobin: 15.1 g/dL — ABNORMAL HIGH (ref 12.0–15.0)
MCH: 31 pg (ref 26.0–34.0)
MCHC: 34.4 g/dL (ref 30.0–36.0)
MCV: 90.1 fL (ref 80.0–100.0)
Platelets: 290 10*3/uL (ref 150–400)
RBC: 4.87 MIL/uL (ref 3.87–5.11)
RDW: 11.9 % (ref 11.5–15.5)
WBC: 7.1 10*3/uL (ref 4.0–10.5)
nRBC: 0 % (ref 0.0–0.2)

## 2021-01-27 LAB — LIPASE, BLOOD: Lipase: 37 U/L (ref 11–51)

## 2021-01-27 LAB — TROPONIN I (HIGH SENSITIVITY)
Troponin I (High Sensitivity): <2 ng/L (ref <18)
Troponin I (High Sensitivity): 4 ng/L (ref <18)

## 2021-01-27 LAB — POC URINE PREG, ED: Preg Test, Ur: NEGATIVE

## 2021-01-27 LAB — D-DIMER, QUANTITATIVE: D-Dimer, Quant: 0.39 ug/mL-FEU (ref 0.00–0.50)

## 2021-01-27 NOTE — ED Notes (Signed)
Lab results reviewed

## 2021-01-27 NOTE — ED Notes (Signed)
Patient reported to first nurse that she was having pain down right arm and into right neck in addition to continued chest pain. Repeat EKG performed.

## 2021-01-27 NOTE — ED Triage Notes (Signed)
Pt comes via EMS from [facility] with c/o central CP and dizziness. Pt states this started about 4 days ago. Pt denies any radiation.  Pt states some nausea.  Pt also dx with UTI today.

## 2021-01-27 NOTE — ED Provider Notes (Signed)
[facility] Emergency Department Provider Note  ____________________________________________   Event Date/Time   First MD Initiated Contact with Patient 01/27/21 1847     (approximate)  I have reviewed the triage vital signs and the nursing notes.   HISTORY  Chief Complaint Chest Pain   HPI April Hoffman is a 40 y.o. female without significant past medical history who presents for assessment of several symptoms including couple days of some intermittent chest discomfort associate with palpitations and a little shortness of breath and lightheadedness earlier today.  She also states she had a couple days of some bloating in her abdomen and burning with urination.  She denies any headache, earache, sore throat, cough, fevers, back pain, blood in her urine, abnormal vaginal discharge, diarrhea, rash extremity symptoms or recent falls or injuries.  Denies any significant EtOH use or illicit drug use.  No other acute concerns at this time.  Denies any tobacco abuse but does have a Nexplanon implant in her left lower extremity.          History reviewed. No pertinent past medical history.  Patient Active Problem List   Diagnosis Date Noted  . Current mild episode of major depressive disorder without prior episode (HCC) 08/22/2019    History reviewed. No pertinent surgical history.  Prior to Admission medications   Medication Sig Start Date End Date Taking? Authorizing Provider  buPROPion (WELLBUTRIN XL) 300 MG 24 hr tablet Take 300 mg by mouth daily. Patient not taking: Reported on 09/16/2020 08/03/19   [provider]  cetirizine (ZYRTEC) 10 MG tablet Take 10 mg by mouth daily.    [provider]  etonogestrel (NEXPLANON) 68 MG IMPL implant Inject 1 each into the skin once as needed. 11/20/18   [provider]  hydrOXYzine (ATARAX/VISTARIL) 25 MG tablet Take 1 tablet (25 mg total) by mouth 3 (three) times daily. Patient not  taking: Reported on 09/16/2020 03/07/20   Corky Downs, MD  VITAMIN D, CHOLECALCIFEROL, PO Take 5,000 Units by mouth daily.    [provider]    Allergies Coconut fatty acids, Coconut oil, Elemental sulfur, and Sulfa antibiotics  Family History  Problem Relation Age of Onset  . Anxiety disorder Brother     Social History Social History   Tobacco Use  . Smoking status: Never Smoker  . Smokeless tobacco: Never Used  Vaping Use  . Vaping Use: Never used  Substance Use Topics  . Alcohol use: Yes    Alcohol/week: 3.0 standard drinks    Types: 3 Glasses of wine per week    Comment: occ  . Drug use: Never    Review of Systems  Review of Systems  Constitutional: Negative for chills and fever.  HENT: Negative for sore throat.   Eyes: Negative for pain.  Respiratory: Positive for shortness of breath. Negative for cough and stridor.   Cardiovascular: Positive for chest pain and palpitations.  Gastrointestinal: Positive for abdominal pain. Negative for vomiting.  Genitourinary: Positive for dysuria.  Musculoskeletal: Negative for myalgias.  Skin: Negative for rash.  Neurological: Positive for dizziness. Negative for seizures, loss of consciousness and headaches.  Psychiatric/Behavioral: Negative for suicidal ideas.  All other systems reviewed and are negative.     ____________________________________________   PHYSICAL EXAM:  VITAL SIGNS: ED Triage Vitals  Enc Vitals Group     BP 01/27/21 1729 133/83     Pulse Rate 01/27/21 1729 83     Resp 01/27/21 1729 18  Temp 01/27/21 1729 98 F (36.7 C)     Temp src --      SpO2 01/27/21 1729 99 %     Weight 01/27/21 1726 152 lb (68.9 kg)     Height 01/27/21 1726 5\' 1"  (1.549 m)     Head Circumference --      Peak Flow --      Pain Score 01/27/21 1726 1     Pain Loc --      Pain Edu? --      Excl. in GC? --    Vitals:   01/27/21 1729  BP: 133/83  Pulse: 83  Resp: 18  Temp: 98 F (36.7 C)  SpO2: 99%    Physical Exam Vitals and nursing note reviewed.  Constitutional:      General: She is not in acute distress.    Appearance: She is well-developed.  HENT:     Head: Normocephalic and atraumatic.     Right Ear: External ear normal.     Left Ear: External ear normal.     Nose: Nose normal.     Mouth/Throat:     Mouth: Mucous membranes are moist.  Eyes:     Conjunctiva/sclera: Conjunctivae normal.  Cardiovascular:     Rate and Rhythm: Normal rate and regular rhythm.     Pulses: Normal pulses.     Heart sounds: No murmur heard.   Pulmonary:     Effort: Pulmonary effort is normal. No respiratory distress.     Breath sounds: Normal breath sounds.  Abdominal:     Palpations: Abdomen is soft.     Tenderness: There is no abdominal tenderness.  Musculoskeletal:     Cervical back: Neck supple.  Skin:    General: Skin is warm and dry.  Neurological:     Mental Status: She is alert and oriented to person, place, and time.  Psychiatric:        Mood and Affect: Mood normal.      ____________________________________________   LABS (all labs ordered are listed, but only abnormal results are displayed)  Labs Reviewed  CBC - Abnormal; Notable for the following components:      Result Value   Hemoglobin 15.1 (*)    All other components within normal limits  URINALYSIS, COMPLETE (UACMP) WITH MICROSCOPIC - Abnormal; Notable for the following components:   Color, Urine STRAW (*)    APPearance CLEAR (*)    Specific Gravity, Urine 1.003 (*)    Bacteria, UA RARE (*)    All other components within normal limits  URINE CULTURE  BASIC METABOLIC PANEL  D-DIMER, QUANTITATIVE  HEPATIC FUNCTION PANEL  LIPASE, BLOOD  POC URINE PREG, ED  TROPONIN I (HIGH SENSITIVITY)  TROPONIN I (HIGH SENSITIVITY)   ____________________________________________  EKG  Sinus rhythm with ventricular rate of 79, normal axis, unremarkable intervals and no clear evidence of acute ischemia or other  significant arrhythmia. ____________________________________________  RADIOLOGY  ED MD interpretation: View from earlier today shows no full consolidation, thorax, effusion, edema or any other clear acute intrathoracic process.   Official radiology report(s): No results found.  ____________________________________________   PROCEDURES  Procedure(s) performed (including Critical Care):  .1-3 Lead EKG Interpretation Performed by: 03/29/21, MD Authorized by: Gilles Chiquito, MD     Interpretation: normal     ECG rate assessment: normal     Rhythm: sinus rhythm     Ectopy: none     Conduction: normal  ____________________________________________   INITIAL IMPRESSION / ASSESSMENT AND PLAN / ED COURSE      Patient presents with above stated history and exam for assessment of some burning with urination, abdominal bloating, intermittent chest discomfort, palpitations, shortness of breath, and lightheadedness earlier today.  On arrival she is afebrile and hemodynamically stable.   Her lungs are clear bilaterally and abdomen is soft nontender throughout.  No CVA tenderness.  With regards to her chest discomfort differential includes ACS, arrhythmia, symptomatic anemia, pneumonia, thorax, symptomatic pleural effusion, PE, metabolic derangements.  Is also possible given she has some bloating she may have some reflux or GERD.  She did undergo an x-ray of her chest at urgent care earlier today which was reviewed by myself and showed no evidence of full consolidation, thorax, effusion, edema or any other clear acute process.   Given ECG shows no clear acute ischemia and troponin x2 nonelevated I have low suspicion for  ACS or myocarditis.  ECG otherwise shows no evidence of significant arrhythmia.  CBC shows no leukocytosis and given absence of fever and reassuring chest x-ray of low suspicion for acute infectious process i.e. pneumonia or bronchitis or viral syndrome.   Patient declined Covid influenza testing D-dimer is less than 0.5 and overall I have a low suspicion for PE.  BMP shows no significant electrolyte or metabolic derangements.  CBC has no leukocytosis or acute anemia to explain patient's palpitations chest discomfort and shortness of breath.  Patient is also complaining of some abdominal bloating and this hepatic function panel and lipase added which showed no evidence of acute pancreatitis, cholestasis, or hepatitis.  Patient's abdomen is generally soft throughout suspicion for appendicitis diverticulitis or other immediate life-threatening intra-abdominal pathology..  Unclear etiology for her urinary symptoms as her urine has some bacteria but no other evidence of infection.  There is no fever elevated blood cell count or CVA tenderness to suggest pyelonephritis she denies any abnormal discharge or bleeding.  Will send urine culture.  With regard to her lightheadedness is certainly possible she is little dehydrated as her pressures are little soft but she was not orthostatic on orthostatic blood pressure testing.  Overall etiology for patient's symptoms although given stable vitals with eyes reassuring exam and work-up I believe suspicion for immediate life-threatening process and believe she is safe for discharge with outpatient follow-up and continued evaluation.  She declined Rx for omeprazole for possible reflux or gastritis.  Advised will follow urine culture for any abnormal bacteria and may call in Rx if there is abnormal bacteria but will defer treatment for UTI at this time given otherwise reassuring UA.  Patient is amenable to this plan.  Discharged stable condition.  Strict return precautions advised and discussed.  Encourage patient to hydrate adequately.      ____________________________________________   FINAL CLINICAL IMPRESSION(S) / ED DIAGNOSES  Final diagnoses:  Chest pain, unspecified type  Palpitations  Dysuria  SOB (shortness of  breath)    Medications - No data to display   ED Discharge Orders    None       Note:  This document was prepared using Dragon voice recognition software and may include unintentional dictation errors.   Gilles Chiquito, MD 01/27/21 2020

## 2021-01-29 LAB — URINE CULTURE

## 2021-02-03 ENCOUNTER — Other Ambulatory Visit: Payer: Self-pay

## 2021-02-03 ENCOUNTER — Encounter: Payer: Self-pay | Admitting: Internal Medicine

## 2021-02-03 ENCOUNTER — Ambulatory Visit: Payer: BC Managed Care – PPO | Admitting: Internal Medicine

## 2021-02-03 VITALS — BP 118/76 | HR 69 | Ht 61.0 in | Wt 149.3 lb

## 2021-02-03 DIAGNOSIS — F419 Anxiety disorder, unspecified: Secondary | ICD-10-CM

## 2021-02-03 DIAGNOSIS — R1310 Dysphagia, unspecified: Secondary | ICD-10-CM | POA: Diagnosis not present

## 2021-02-03 DIAGNOSIS — K219 Gastro-esophageal reflux disease without esophagitis: Secondary | ICD-10-CM | POA: Diagnosis not present

## 2021-02-03 DIAGNOSIS — R079 Chest pain, unspecified: Secondary | ICD-10-CM | POA: Diagnosis not present

## 2021-02-03 NOTE — Assessment & Plan Note (Signed)
Patient chest pain is atypical EKG is normal.  She denies any history of chest pain exertion.  She is minimally tender in the right upper quadrant.  We will do an ultrasound of the abdomen.  I will also do an echocardiogram and stress test.

## 2021-02-03 NOTE — Assessment & Plan Note (Signed)
-   Patient experiencing high levels of anxiety.  - Encouraged patient to engage in relaxing activities like yoga, meditation, journaling, going for a walk, or participating in a hobby.  - Encouraged patient to reach out to trusted friends or family members about recent struggles 

## 2021-02-03 NOTE — Progress Notes (Signed)
New Patient Office Visit  Subjective:  Patient ID: April Hoffman, female    DOB: 10-08-1981  Age: 40 y.o. MRN: [id number]  CC:  Chief Complaint  Patient presents with  . Follow-up    Patient was seen at ED for chest pain     HPI Patient presents for chest pain, patient complains of intermittent substernal chest pain which does not radiate to the neck shoulder or arm.  She has a problem with reflux and has been taking Prilosec.  He complains of increased belching.  No past medical history on file.   Current Outpatient Medications:  .  buPROPion (WELLBUTRIN XL) 300 MG 24 hr tablet, Take 300 mg by mouth daily., Disp: , Rfl:  .  cetirizine (ZYRTEC) 10 MG tablet, Take 10 mg by mouth daily., Disp: , Rfl:  .  etonogestrel (NEXPLANON) 68 MG IMPL implant, Inject 1 each into the skin once as needed., Disp: , Rfl:  .  hydrOXYzine (ATARAX/VISTARIL) 25 MG tablet, Take 1 tablet (25 mg total) by mouth 3 (three) times daily., Disp: 90 tablet, Rfl: 1 .  VITAMIN D, CHOLECALCIFEROL, PO, Take 5,000 Units by mouth daily., Disp: , Rfl:    No past surgical history on file.  Family History  Problem Relation Age of Onset  . Anxiety disorder Brother     Social History   Socioeconomic History  . Marital status: Married    Spouse name: Barbara Cower  . Number of children: 0  . Years of education: Not on file  . Highest education level: Professional school degree (e.g., MD, DDS, DVM, JD)  Occupational History  . Not on file  Tobacco Use  . Smoking status: Never Smoker  . Smokeless tobacco: Never Used  Vaping Use  . Vaping Use: Never used  Substance and Sexual Activity  . Alcohol use: Yes    Alcohol/week: 3.0 standard drinks    Types: 3 Glasses of wine per week    Comment: occ  . Drug use: Never  . Sexual activity: Yes  Other Topics Concern  . Not on file  Social History Narrative  . Not on file   Social Determinants of Health   Financial Resource Strain: Not on file  Food Insecurity: Not  on file  Transportation Needs: Not on file  Physical Activity: Not on file  Stress: Not on file  Social Connections: Not on file  Intimate Partner Violence: Not on file    ROS Review of Systems  Constitutional: Negative.  Negative for appetite change and fever.  HENT: Negative.   Eyes: Negative.   Respiratory: Positive for shortness of breath. Negative for chest tightness.   Cardiovascular: Positive for chest pain. Negative for palpitations and leg swelling.  Gastrointestinal: Negative.  Negative for abdominal distention, abdominal pain and constipation.  Endocrine: Negative.  Negative for polyphagia.  Genitourinary: Negative.  Negative for flank pain.  Musculoskeletal: Negative.   Skin: Negative.  Negative for rash.  Allergic/Immunologic: Negative.   Neurological: Negative.   Hematological: Negative.   Psychiatric/Behavioral: Negative.  Negative for behavioral problems and dysphoric mood.  All other systems reviewed and are negative.   Objective:   Today's Vitals: BP 118/76   Pulse 69   Ht 5\' 1"  (1.549 m)   Wt 149 lb 4.8 oz (67.7 kg)   BMI 28.21 kg/m   Physical Exam Constitutional:      General: She is not in acute distress.    Appearance: Normal appearance. She is normal weight.  HENT:  Head: Normocephalic.     Mouth/Throat:     Mouth: Mucous membranes are moist.  Cardiovascular:     Rate and Rhythm: Normal rate and regular rhythm.     Pulses: Normal pulses.  Pulmonary:     Effort: Pulmonary effort is normal.     Breath sounds: No wheezing.  Abdominal:     General: There is no distension.     Tenderness: There is abdominal tenderness.  Musculoskeletal:        General: No swelling.     Cervical back: Normal range of motion.  Skin:    Coloration: Skin is not jaundiced.     Findings: No bruising.  Neurological:     Mental Status: She is alert.     Assessment & Plan:   Problem List Items Addressed This Visit      Digestive   Gastroesophageal  reflux disease without esophagitis    - The patient's GERD is stable on medication.  - Instructed the patient to avoid eating spicy and acidic foods, as well as foods high in fat. - Instructed the patient to avoid eating large meals or meals 2-3 hours prior to sleeping.      Dysphagia - Primary    Patient was advised to use Prilosec 20 mg twice a day      Relevant Orders   Ambulatory referral to Gastroenterology   US Abdomen Limited RUQ (LIVER/GB)     Other   Chest pain at rest    Patient chest pain is atypical EKG is normal.  She denies any history of chest pain exertion.  She is minimally tender in the right upper quadrant.  We will do an ultrasound of the abdomen.  I will also do an echocardiogram and stress test.      Anxiety    - Patient experiencing high levels of anxiety.  - Encouraged patient to engage in relaxing activities like yoga, meditation, journaling, going for a walk, or participating in a hobby.  - Encouraged patient to reach out to trusted friends or family members about recent struggles         Outpatient Encounter Medications as of 02/03/2021  Medication Sig  . buPROPion (WELLBUTRIN XL) 300 MG 24 hr tablet Take 300 mg by mouth daily.  . cetirizine (ZYRTEC) 10 MG tablet Take 10 mg by mouth daily.  Marland Kitchen etonogestrel (NEXPLANON) 68 MG IMPL implant Inject 1 each into the skin once as needed.  . hydrOXYzine (ATARAX/VISTARIL) 25 MG tablet Take 1 tablet (25 mg total) by mouth 3 (three) times daily.  Marland Kitchen VITAMIN D, CHOLECALCIFEROL, PO Take 5,000 Units by mouth daily.   No facility-administered encounter medications on file as of 02/03/2021.    Follow-up: No follow-ups on file.   Corky Downs, MD

## 2021-02-03 NOTE — Assessment & Plan Note (Signed)
-   The patient's GERD is stable on medication.  - Instructed the patient to avoid eating spicy and acidic foods, as well as foods high in fat. - Instructed the patient to avoid eating large meals or meals 2-3 hours prior to sleeping. 

## 2021-02-03 NOTE — Assessment & Plan Note (Signed)
Patient was advised to use Prilosec 20 mg twice a day

## 2021-02-04 ENCOUNTER — Ambulatory Visit (INDEPENDENT_AMBULATORY_CARE_PROVIDER_SITE_OTHER): Payer: BC Managed Care – PPO

## 2021-02-04 ENCOUNTER — Encounter: Payer: Self-pay | Admitting: *Deleted

## 2021-02-04 DIAGNOSIS — K219 Gastro-esophageal reflux disease without esophagitis: Secondary | ICD-10-CM

## 2021-02-04 DIAGNOSIS — R1310 Dysphagia, unspecified: Secondary | ICD-10-CM

## 2021-02-09 ENCOUNTER — Other Ambulatory Visit (INDEPENDENT_AMBULATORY_CARE_PROVIDER_SITE_OTHER): Payer: BC Managed Care – PPO

## 2021-02-09 ENCOUNTER — Other Ambulatory Visit: Payer: Self-pay

## 2021-02-09 DIAGNOSIS — R079 Chest pain, unspecified: Secondary | ICD-10-CM

## 2021-02-09 LAB — COMPLETE METABOLIC PANEL WITH GFR
AG Ratio: 1.8 (calc) (ref 1.0–2.5)
ALT: 21 U/L (ref 6–29)
AST: 17 U/L (ref 10–30)
Albumin: 4.6 g/dL (ref 3.6–5.1)
Alkaline phosphatase (APISO): 52 U/L (ref 31–125)
BUN: 8 mg/dL (ref 7–25)
CO2: 23 mmol/L (ref 20–32)
Calcium: 9.4 mg/dL (ref 8.6–10.2)
Chloride: 106 mmol/L (ref 98–110)
Creat: 0.8 mg/dL (ref 0.50–1.10)
GFR, Est African American: 108 mL/min/{1.73_m2} (ref >=60)
GFR, Est Non African American: 93 mL/min/{1.73_m2} (ref >=60)
Globulin: 2.5 g/dL (calc) (ref 1.9–3.7)
Glucose, Bld: 86 mg/dL (ref 65–99)
Potassium: 4.6 mmol/L (ref 3.5–5.3)
Sodium: 140 mmol/L (ref 135–146)
Total Bilirubin: 0.7 mg/dL (ref 0.2–1.2)
Total Protein: 7.1 g/dL (ref 6.1–8.1)

## 2021-02-09 LAB — CBC WITH DIFFERENTIAL/PLATELET
Absolute Monocytes: 505 cells/uL (ref 200–950)
Basophils Absolute: 41 cells/uL (ref 0–200)
Basophils Relative: 0.7 %
Eosinophils Absolute: 151 cells/uL (ref 15–500)
Eosinophils Relative: 2.6 %
HCT: 44.1 % (ref 35.0–45.0)
Hemoglobin: 14.7 g/dL (ref 11.7–15.5)
Lymphs Abs: 1908 cells/uL (ref 850–3900)
MCH: 31 pg (ref 27.0–33.0)
MCHC: 33.3 g/dL (ref 32.0–36.0)
MCV: 93 fL (ref 80.0–100.0)
MPV: 11.2 fL (ref 7.5–12.5)
Monocytes Relative: 8.7 %
Neutro Abs: 3196 cells/uL (ref 1500–7800)
Neutrophils Relative %: 55.1 %
Platelets: 286 10*3/uL (ref 140–400)
RBC: 4.74 10*6/uL (ref 3.80–5.10)
RDW: 12 % (ref 11.0–15.0)
Total Lymphocyte: 32.9 %
WBC: 5.8 10*3/uL (ref 3.8–10.8)

## 2021-02-09 LAB — TEST AUTHORIZATION

## 2021-02-09 LAB — LIPID PANEL
Cholesterol: 167 mg/dL (ref <200)
HDL: 47 mg/dL — ABNORMAL LOW (ref >=50)
LDL Cholesterol (Calc): 97 mg/dL (calc)
Non-HDL Cholesterol (Calc): 120 mg/dL (calc) (ref <130)
Total CHOL/HDL Ratio: 3.6 (calc) (ref <5.0)
Triglycerides: 129 mg/dL (ref <150)

## 2021-02-09 LAB — HEMOGLOBIN A1C
Hgb A1c MFr Bld: 5.1 % of total Hgb (ref <5.7)
Mean Plasma Glucose: 100 mg/dL
eAG (mmol/L): 5.5 mmol/L

## 2021-02-09 LAB — TSH: TSH: 1.38 mIU/L

## 2021-02-09 NOTE — Progress Notes (Unsigned)
Established Patient Office Visit  Subjective:  Patient ID: April Hoffman, female    DOB: July 13, 1981  Age: 40 y.o. MRN: [id number]  CC: No chief complaint on file.   HPI  April Hoffman presents for echocardiogram  History reviewed. No pertinent past medical history.  History reviewed. No pertinent surgical history.  Family History  Problem Relation Age of Onset  . Anxiety disorder Brother     Social History   Socioeconomic History  . Marital status: Married    Spouse name: Barbara Cower  . Number of children: 0  . Years of education: Not on file  . Highest education level: Professional school degree (e.g., MD, DDS, DVM, JD)  Occupational History  . Not on file  Tobacco Use  . Smoking status: Never Smoker  . Smokeless tobacco: Never Used  Vaping Use  . Vaping Use: Never used  Substance and Sexual Activity  . Alcohol use: Yes    Alcohol/week: 3.0 standard drinks    Types: 3 Glasses of wine per week    Comment: occ  . Drug use: Never  . Sexual activity: Yes  Other Topics Concern  . Not on file  Social History Narrative  . Not on file   Social Determinants of Health   Financial Resource Strain: Not on file  Food Insecurity: Not on file  Transportation Needs: Not on file  Physical Activity: Not on file  Stress: Not on file  Social Connections: Not on file  Intimate Partner Violence: Not on file     Current Outpatient Medications:  .  buPROPion (WELLBUTRIN XL) 300 MG 24 hr tablet, Take 300 mg by mouth daily., Disp: , Rfl:  .  cetirizine (ZYRTEC) 10 MG tablet, Take 10 mg by mouth daily., Disp: , Rfl:  .  etonogestrel (NEXPLANON) 68 MG IMPL implant, Inject 1 each into the skin once as needed., Disp: , Rfl:  .  hydrOXYzine (ATARAX/VISTARIL) 25 MG tablet, Take 1 tablet (25 mg total) by mouth 3 (three) times daily., Disp: 90 tablet, Rfl: 1 .  VITAMIN D, CHOLECALCIFEROL, PO, Take 5,000 Units by mouth daily., Disp: , Rfl:    Allergies  Allergen Reactions  . Coconut  Fatty Acids   . Coconut Oil     All coconut   . Elemental Sulfur     Vomiting after first dose   . Sulfa Antibiotics     ROS Review of Systems  Constitutional: Negative.   HENT: Negative.   Eyes: Negative.   Respiratory: Negative.   Cardiovascular: Negative.   Gastrointestinal: Negative.   Endocrine: Negative.   Genitourinary: Negative.   Musculoskeletal: Negative.   Skin: Negative.   Allergic/Immunologic: Negative.   Neurological: Negative.   Hematological: Negative.   Psychiatric/Behavioral: Negative.   All other systems reviewed and are negative.     Objective:    Physical Exam Cardiovascular:     Rate and Rhythm: Normal rate and regular rhythm.     Pulses: Normal pulses.     Heart sounds: No murmur heard. No friction rub. No gallop.   Pulmonary:     Effort: Pulmonary effort is normal.     Breath sounds: Normal breath sounds.  Abdominal:     General: There is no distension.     Palpations: There is no mass.     Tenderness: There is no abdominal tenderness.     Hernia: No hernia is present.     There were no vitals taken for this visit. Wt Readings from Last 3 Encounters:  02/03/21 149 lb 4.8 oz (67.7 kg)  01/27/21 152 lb (68.9 kg)  12/04/18 143 lb 6.4 oz (65 kg)     Health Maintenance Due  Topic Date Due  . Hepatitis C Screening  Never done  . HIV Screening  Never done  . TETANUS/TDAP  Never done  . PAP SMEAR-Modifier  Never done    There are no preventive care reminders to display for this patient.  Lab Results  Component Value Date   TSH 1.38 02/04/2021   Lab Results  Component Value Date   WBC 5.8 02/04/2021   HGB 14.7 02/04/2021   HCT 44.1 02/04/2021   MCV 93.0 02/04/2021   PLT 286 02/04/2021   Lab Results  Component Value Date   NA 140 02/04/2021   K 4.6 02/04/2021   CO2 23 02/04/2021   GLUCOSE 86 02/04/2021   BUN 8 02/04/2021   CREATININE 0.80 02/04/2021   BILITOT 0.7 02/04/2021   ALKPHOS 50 01/27/2021   AST 17 02/04/2021    ALT 21 02/04/2021   PROT 7.1 02/04/2021   ALBUMIN 4.4 01/27/2021   CALCIUM 9.4 02/04/2021   ANIONGAP 8 01/27/2021   Lab Results  Component Value Date   CHOL 167 02/04/2021   Lab Results  Component Value Date   HDL 47 (L) 02/04/2021   Lab Results  Component Value Date   LDLCALC 97 02/04/2021   Lab Results  Component Value Date   TRIG 129 02/04/2021   Lab Results  Component Value Date   CHOLHDL 3.6 02/04/2021   Lab Results  Component Value Date   HGBA1C 5.1 02/04/2021      Assessment & Plan:   Problem List Items Addressed This Visit      Other   Chest pain at rest - Primary    Echocardiogram will be performed today       [facility] [address] [place], [place] [address] Phone: [phone] Fax:  [phone]  Transthoracic Echocardiogram Note  April Hoffman [id number] [birth year]  Procedure: Transthoracic Echocardiogram Indications: Chest pain Verbal Consent: Obtained  Procedure Details Two-dimensional and M-mode echocardiogram was obtained in the long axis short axis and apical four-chamber view.  Echocardiogram revealed good LV contractility.  Right ventricular function is good.  Mitral aortic and tricuspid valve is normal.  There is no pericardial effusion no evidence of pericarditis.  Tricuspid valve is normal aortic valve is normal.  No wall motion abnormalities noted.  Estimated left ventricular ejection fraction is 55%.   Technical quality: good  Resting Measurements: Within normal limits  Left Ventrical: Size is normal.  Mitral Valve: Mitral valve is normal with good anterior and posterior mitral leaflet excursion  Aortic Valve: Aortic valve is tricuspid opens well no regurgitation noted  Tricuspid Valve: Tricuspid valve is normal  Pulmonic Valve: Pulmonic valve seen and appears to be normal  Left Atrium/ Left atrial appendage: No blood clot is noted  Atrial septum:   Aorta: Aortic root is normal aortic  valve is tricuspid   Complications: No apparent complications Patient did tolerate procedure well.  Corky Downs, MD    No orders of the defined types were placed in this encounter.   Follow-up: No follow-ups on file.    Corky Downs, MD

## 2021-02-09 NOTE — Assessment & Plan Note (Signed)
Echocardiogram will be performed today

## 2021-02-10 ENCOUNTER — Other Ambulatory Visit: Payer: Self-pay

## 2021-02-10 ENCOUNTER — Other Ambulatory Visit (INDEPENDENT_AMBULATORY_CARE_PROVIDER_SITE_OTHER): Payer: BC Managed Care – PPO

## 2021-02-10 DIAGNOSIS — R079 Chest pain, unspecified: Secondary | ICD-10-CM | POA: Diagnosis not present

## 2021-02-10 NOTE — Assessment & Plan Note (Signed)
Patient was scheduled for a stress test

## 2021-02-10 NOTE — Progress Notes (Unsigned)
Established Patient Office Visit  Subjective:  Patient ID: April Hoffman, female    DOB: 02-19-1981  Age: 40 y.o. MRN: [id number]  CC: No chief complaint on file.   HPI  April Hoffman presents for stress test for chest pain  No past medical history on file.  No past surgical history on file.  Family History  Problem Relation Age of Onset  . Anxiety disorder Brother     Social History   Socioeconomic History  . Marital status: Married    Spouse name: Barbara Cower  . Number of children: 0  . Years of education: Not on file  . Highest education level: Professional school degree (e.g., MD, DDS, DVM, JD)  Occupational History  . Not on file  Tobacco Use  . Smoking status: Never Smoker  . Smokeless tobacco: Never Used  Vaping Use  . Vaping Use: Never used  Substance and Sexual Activity  . Alcohol use: Yes    Alcohol/week: 3.0 standard drinks    Types: 3 Glasses of wine per week    Comment: occ  . Drug use: Never  . Sexual activity: Yes  Other Topics Concern  . Not on file  Social History Narrative  . Not on file   Social Determinants of Health   Financial Resource Strain: Not on file  Food Insecurity: Not on file  Transportation Needs: Not on file  Physical Activity: Not on file  Stress: Not on file  Social Connections: Not on file  Intimate Partner Violence: Not on file     Current Outpatient Medications:  .  buPROPion (WELLBUTRIN XL) 300 MG 24 hr tablet, Take 300 mg by mouth daily., Disp: , Rfl:  .  cetirizine (ZYRTEC) 10 MG tablet, Take 10 mg by mouth daily., Disp: , Rfl:  .  etonogestrel (NEXPLANON) 68 MG IMPL implant, Inject 1 each into the skin once as needed., Disp: , Rfl:  .  hydrOXYzine (ATARAX/VISTARIL) 25 MG tablet, Take 1 tablet (25 mg total) by mouth 3 (three) times daily., Disp: 90 tablet, Rfl: 1 .  VITAMIN D, CHOLECALCIFEROL, PO, Take 5,000 Units by mouth daily., Disp: , Rfl:    Allergies  Allergen Reactions  . Coconut Fatty Acids   .  Coconut Oil     All coconut   . Elemental Sulfur     Vomiting after first dose   . Sulfa Antibiotics     ROS Review of Systems  Constitutional: Negative.   HENT: Negative.   Eyes: Negative.   Respiratory: Negative.   Cardiovascular: Negative.   Gastrointestinal: Negative.   Endocrine: Negative.   Genitourinary: Negative.   Musculoskeletal: Negative.   Skin: Negative.   Allergic/Immunologic: Negative.   Neurological: Negative.   Hematological: Negative.   Psychiatric/Behavioral: Negative.   All other systems reviewed and are negative.     Objective:    Physical Exam Vitals reviewed.  Constitutional:      Appearance: Normal appearance.  HENT:     Mouth/Throat:     Mouth: Mucous membranes are moist.  Eyes:     Pupils: Pupils are equal, round, and reactive to light.  Neck:     Vascular: No carotid bruit.  Cardiovascular:     Rate and Rhythm: Normal rate and regular rhythm.     Pulses: Normal pulses.     Heart sounds: Normal heart sounds.  Pulmonary:     Effort: Pulmonary effort is normal.     Breath sounds: Normal breath sounds.  Abdominal:  General: Bowel sounds are normal.     Palpations: Abdomen is soft. There is no hepatomegaly, splenomegaly or mass.     Tenderness: There is no abdominal tenderness.     Hernia: No hernia is present.  Musculoskeletal:        General: No tenderness.     Cervical back: Neck supple.     Right lower leg: No edema.     Left lower leg: No edema.  Skin:    Findings: No rash.  Neurological:     Mental Status: She is alert and oriented to person, place, and time.     Motor: No weakness.  Psychiatric:        Mood and Affect: Mood and affect normal.        Behavior: Behavior normal.     There were no vitals taken for this visit. Wt Readings from Last 3 Encounters:  02/03/21 149 lb 4.8 oz (67.7 kg)  01/27/21 152 lb (68.9 kg)  12/04/18 143 lb 6.4 oz (65 kg)     Health Maintenance Due  Topic Date Due  . Hepatitis C  Screening  Never done  . HIV Screening  Never done  . TETANUS/TDAP  Never done  . PAP SMEAR-Modifier  Never done    There are no preventive care reminders to display for this patient.  Lab Results  Component Value Date   TSH 1.38 02/04/2021   Lab Results  Component Value Date   WBC 5.8 02/04/2021   HGB 14.7 02/04/2021   HCT 44.1 02/04/2021   MCV 93.0 02/04/2021   PLT 286 02/04/2021   Lab Results  Component Value Date   NA 140 02/04/2021   K 4.6 02/04/2021   CO2 23 02/04/2021   GLUCOSE 86 02/04/2021   BUN 8 02/04/2021   CREATININE 0.80 02/04/2021   BILITOT 0.7 02/04/2021   ALKPHOS 50 01/27/2021   AST 17 02/04/2021   ALT 21 02/04/2021   PROT 7.1 02/04/2021   ALBUMIN 4.4 01/27/2021   CALCIUM 9.4 02/04/2021   ANIONGAP 8 01/27/2021   Lab Results  Component Value Date   CHOL 167 02/04/2021   Lab Results  Component Value Date   HDL 47 (L) 02/04/2021   Lab Results  Component Value Date   LDLCALC 97 02/04/2021   Lab Results  Component Value Date   TRIG 129 02/04/2021   Lab Results  Component Value Date   CHOLHDL 3.6 02/04/2021   Lab Results  Component Value Date   HGBA1C 5.1 02/04/2021      Assessment & Plan:   Problem List Items Addressed This Visit   None   Report of the stress test Patient was exercised according to Bruce protocol,  exercise test was stopped at the end of 9 minutes due to shortness of breath,  she did not have any chest pain, no ST changes or myocardial ischemia was noted. Blood pressure was found to be 150/80. Exercise stress is considered negative for angina.  No orders of the defined types were placed in this encounter.   Follow-up: No follow-ups on file.    Corky Downs, MD

## 2021-02-25 ENCOUNTER — Ambulatory Visit: Payer: BC Managed Care – PPO | Admitting: Gastroenterology

## 2021-03-05 ENCOUNTER — Other Ambulatory Visit: Payer: Self-pay | Admitting: Internal Medicine

## 2021-04-20 DIAGNOSIS — E538 Deficiency of other specified B group vitamins: Secondary | ICD-10-CM | POA: Diagnosis not present

## 2021-05-25 DIAGNOSIS — F410 Panic disorder [episodic paroxysmal anxiety] without agoraphobia: Secondary | ICD-10-CM | POA: Insufficient documentation

## 2021-05-25 DIAGNOSIS — F411 Generalized anxiety disorder: Secondary | ICD-10-CM | POA: Diagnosis not present

## 2021-05-25 DIAGNOSIS — E538 Deficiency of other specified B group vitamins: Secondary | ICD-10-CM | POA: Insufficient documentation

## 2021-05-25 DIAGNOSIS — Z23 Encounter for immunization: Secondary | ICD-10-CM | POA: Diagnosis not present

## 2021-05-25 DIAGNOSIS — Z0000 Encounter for general adult medical examination without abnormal findings: Secondary | ICD-10-CM | POA: Diagnosis not present

## 2021-05-25 DIAGNOSIS — F331 Major depressive disorder, recurrent, moderate: Secondary | ICD-10-CM | POA: Diagnosis not present

## 2021-05-25 DIAGNOSIS — K219 Gastro-esophageal reflux disease without esophagitis: Secondary | ICD-10-CM | POA: Diagnosis not present

## 2021-05-26 DIAGNOSIS — Z13220 Encounter for screening for lipoid disorders: Secondary | ICD-10-CM | POA: Diagnosis not present

## 2021-05-26 DIAGNOSIS — Z1159 Encounter for screening for other viral diseases: Secondary | ICD-10-CM | POA: Diagnosis not present

## 2021-05-26 DIAGNOSIS — Z114 Encounter for screening for human immunodeficiency virus [HIV]: Secondary | ICD-10-CM | POA: Diagnosis not present

## 2021-05-26 DIAGNOSIS — Z0000 Encounter for general adult medical examination without abnormal findings: Secondary | ICD-10-CM | POA: Diagnosis not present

## 2021-05-26 DIAGNOSIS — Z131 Encounter for screening for diabetes mellitus: Secondary | ICD-10-CM | POA: Diagnosis not present

## 2021-06-24 ENCOUNTER — Encounter: Payer: Self-pay | Admitting: Nurse Practitioner

## 2021-06-24 ENCOUNTER — Ambulatory Visit: Payer: BC Managed Care – PPO | Admitting: Nurse Practitioner

## 2021-06-24 ENCOUNTER — Other Ambulatory Visit: Payer: Self-pay

## 2021-06-24 VITALS — BP 120/72 | HR 86 | Temp 99.6°F | Resp 16

## 2021-06-24 DIAGNOSIS — Z20822 Contact with and (suspected) exposure to covid-19: Secondary | ICD-10-CM

## 2021-06-24 LAB — POC COVID19 BINAXNOW: SARS Coronavirus 2 Ag: POSITIVE — AB

## 2021-06-24 NOTE — Progress Notes (Signed)
   Subjective:    Patient ID: April Hoffman, female    DOB: Feb 10, 1981, 40 y.o.   MRN: [id number]  HPI 40 year old female presenting to [facility] with concern for COVID infection today.   Her husband tested positive on Monday 06/22/21,she has been in close contact with him.   Patient started to have symptoms of cough & sore throat on 06/22/21  She has been vaccinated with Pfizer x3   Today's Vitals   06/24/21 1312  BP: 120/72  Pulse: 86  Resp: 16  Temp: 99.6 F (37.6 C)  TempSrc: Tympanic  SpO2: 98%   There is no height or weight on file to calculate BMI.    Review of Systems  Constitutional: Negative.   HENT:  Positive for congestion and sore throat.   Respiratory:  Positive for cough.   Cardiovascular: Negative.   Gastrointestinal: Negative.   Genitourinary: Negative.   Musculoskeletal:  Positive for myalgias.  Skin: Negative.   Neurological: Negative.       Objective:   Physical Exam HENT:     Head: Normocephalic.     Right Ear: Tympanic membrane, ear canal and external ear normal.     Left Ear: Tympanic membrane, ear canal and external ear normal.     Nose: Nose normal.     Mouth/Throat:     Mouth: Mucous membranes are moist.  Eyes:     Pupils: Pupils are equal, round, and reactive to light.  Cardiovascular:     Rate and Rhythm: Normal rate and regular rhythm.     Pulses: Normal pulses.     Heart sounds: Normal heart sounds.  Pulmonary:     Breath sounds: Normal breath sounds.  Musculoskeletal:     Cervical back: Normal range of motion.  Skin:    General: Skin is warm.  Neurological:     Mental Status: She is alert.          Assessment & Plan:  1. Encounter for screening laboratory testing for COVID-19 virus  - POC COVID-19 positive   Remain in quarantine through end of week, may return to work if symptoms are improving 06/29/2021 Work note sent to HR  Patient instructed on symptom management. She will use OTC decongestant  and cough medication as needed. Return to clinic with new or worsening symptoms as discussed.

## 2021-07-08 ENCOUNTER — Ambulatory Visit: Payer: BC Managed Care – PPO | Admitting: Nurse Practitioner

## 2021-07-08 ENCOUNTER — Other Ambulatory Visit: Payer: Self-pay

## 2021-07-08 ENCOUNTER — Encounter: Payer: Self-pay | Admitting: Nurse Practitioner

## 2021-07-08 VITALS — BP 114/72 | HR 70 | Temp 98.5°F | Resp 16

## 2021-07-08 DIAGNOSIS — J4521 Mild intermittent asthma with (acute) exacerbation: Secondary | ICD-10-CM

## 2021-07-08 DIAGNOSIS — R059 Cough, unspecified: Secondary | ICD-10-CM

## 2021-07-08 DIAGNOSIS — J22 Unspecified acute lower respiratory infection: Secondary | ICD-10-CM

## 2021-07-08 MED ORDER — BENZONATATE 100 MG PO CAPS: 100.0000 mg | ORAL_CAPSULE | Freq: Three times a day (TID) | ORAL | 0 refills | Status: DC | PRN

## 2021-07-08 MED ORDER — DOXYCYCLINE HYCLATE 100 MG PO TABS: 100.0000 mg | ORAL_TABLET | Freq: Two times a day (BID) | ORAL | 0 refills | Status: AC

## 2021-07-08 MED ORDER — ALBUTEROL SULFATE HFA 108 (90 BASE) MCG/ACT IN AERS: 2.0000 | INHALATION_SPRAY | Freq: Four times a day (QID) | RESPIRATORY_TRACT | 0 refills | Status: DC | PRN

## 2021-07-08 NOTE — Progress Notes (Signed)
   Subjective:    Patient ID: April Hoffman, female    DOB: 07/20/1981, 40 y.o.   MRN: [id number]  HPI  40 year old female presenting to [facility] with complaints of ongoing symptoms related to recent COVID infection.   She had COVID two weeks ago and has ongoing fatigue, has been sleeping a lot and recovers slightly over the weekend and then fatigues throughout the week.   She feels pain in her lungs from coughing Using Dayquil OTC   History of asthma as a child  Has not used an inhaler for over 20 years    Review of Systems  Constitutional:  Positive for fatigue.  HENT:  Positive for congestion.   Eyes: Negative.   Respiratory:  Positive for cough and shortness of breath.   Cardiovascular: Negative.   Endocrine: Negative.   Genitourinary: Negative.       Objective:   Physical Exam HENT:     Head: Normocephalic.     Right Ear: Tympanic membrane, ear canal and external ear normal.     Left Ear: Tympanic membrane, ear canal and external ear normal.  Eyes:     Pupils: Pupils are equal, round, and reactive to light.  Cardiovascular:     Rate and Rhythm: Normal rate and regular rhythm.     Heart sounds: Normal heart sounds.  Pulmonary:     Effort: Pulmonary effort is normal.     Breath sounds: Rhonchi present.  Musculoskeletal:     Cervical back: Normal range of motion.  Skin:    General: Skin is warm.  Neurological:     General: No focal deficit present.     Mental Status: She is alert.          Assessment & Plan:   1. Lower respiratory infection  - doxycycline (VIBRA-TABS) 100 MG tablet; Take 1 tablet (100 mg total) by mouth 2 (two) times daily for 7 days. Take with food  Dispense: 14 tablet; Refill: 0 - albuterol (VENTOLIN HFA) 108 (90 Base) MCG/ACT inhaler; Inhale 2 puffs into the lungs every 6 (six) hours as needed for wheezing or shortness of breath.  Dispense: 8 g; Refill: 0 - benzonatate (TESSALON) 100 MG capsule; Take 1 capsule (100  mg total) by mouth 3 (three) times daily as needed for cough.  Dispense: 30 capsule; Refill: 0  2. Mild intermittent asthma with acute exacerbation  - albuterol (VENTOLIN HFA) 108 (90 Base) MCG/ACT inhaler; Inhale 2 puffs into the lungs every 6 (six) hours as needed for wheezing or shortness of breath.  Dispense: 8 g; Refill: 0 - benzonatate (TESSALON) 100 MG capsule; Take 1 capsule (100 mg total) by mouth 3 (three) times daily as needed for cough.  Dispense: 30 capsule; Refill: 0  3. Cough  - albuterol (VENTOLIN HFA) 108 (90 Base) MCG/ACT inhaler; Inhale 2 puffs into the lungs every 6 (six) hours as needed for wheezing or shortness of breath.  Dispense: 8 g; Refill: 0 - benzonatate (TESSALON) 100 MG capsule; Take 1 capsule (100 mg total) by mouth 3 (three) times daily as needed for cough.  Dispense: 30 capsule; Refill: 0     RTC if symptoms persist will consider prednisone course

## 2021-07-10 ENCOUNTER — Ambulatory Visit
Admission: RE | Admit: 2021-07-10 | Discharge: 2021-07-10 | Disposition: A | Discharge status: Home / Self Care | Payer: BC Managed Care – PPO | Attending: Nurse Practitioner | Admitting: Nurse Practitioner

## 2021-07-10 ENCOUNTER — Other Ambulatory Visit: Payer: Self-pay

## 2021-07-10 ENCOUNTER — Ambulatory Visit
Admission: RE | Admit: 2021-07-10 | Discharge: 2021-07-10 | Disposition: A | Discharge status: Home / Self Care | Payer: BC Managed Care – PPO | Source: Ambulatory Visit | Attending: Nurse Practitioner | Admitting: Nurse Practitioner

## 2021-07-10 ENCOUNTER — Ambulatory Visit: Payer: BC Managed Care – PPO | Admitting: Nurse Practitioner

## 2021-07-10 DIAGNOSIS — U071 COVID-19: Secondary | ICD-10-CM

## 2021-07-10 DIAGNOSIS — R0602 Shortness of breath: Secondary | ICD-10-CM

## 2021-07-10 DIAGNOSIS — R059 Cough, unspecified: Secondary | ICD-10-CM | POA: Diagnosis not present

## 2021-07-10 MED ORDER — PREDNISONE 10 MG (21) PO TBPK: ORAL_TABLET | ORAL | 0 refills | Status: DC

## 2021-07-10 NOTE — Progress Notes (Signed)
   Subjective:    Patient ID: April Hoffman, female    DOB: Jan 05, 1981, 40 y.o.   MRN: [id number]  HPI  40 year old female continuing to struggle with post COVID symptoms. Diagnosed 15 days ago. Initially had minimal symptoms, was seen 48 hours ago and started on doxycyline, albuterol inhaler and benzonatate for cough support.   She has continued to have SOB, states that it feels like there is a band around her chest. Does have slight improvement after using inhaler.   IS also dealing with end of life situation with Mother in [place] that is out of state. Has been having a difficult time sleeping.    Consents to telehealth appointment  Review of Systems  Constitutional:  Positive for fatigue.  HENT:  Positive for congestion.   Respiratory:  Positive for cough and shortness of breath.       Objective:   Physical Exam  There was no physical exam performed, patient sounded short of breath during phone conversation with provider      Assessment & Plan:   1. Shortness of breath Sent for Stat chest XRAY will follow up with results when available.  - DG Chest 2 View; Future XRAY results: CLINICAL DATA:  Shortness of breath, cough.   EXAM: CHEST - 2 VIEW   COMPARISON:  None.   FINDINGS: The heart size and mediastinal contours are within normal limits. Rounded density is seen over right lower lobe which may represent overlying nipple shadow. Lungs are otherwise clear. The visualized skeletal structures are unremarkable.   IMPRESSION: Rounded density is seen over right lower lobe which may represent overlying nipple shadow; repeat radiograph with nipple markers is recommended for confirmation and to rule out pulmonary nodule.   Will order imaging repeat patient will go Monday  Start prednisone taper over the weekend and continue previous regimen as discussed.   Meds ordered this encounter  Medications   predniSONE (STERAPRED UNI-PAK 21 TAB) 10 MG (21) TBPK tablet    Sig:  Take 6 tablets on day one, 5 on day two, 4 on day three, 3 on day four, 2 on day five, and 1 on day six. Take with food.    Dispense:  21 tablet    Refill:  0

## 2021-07-16 ENCOUNTER — Telehealth: Payer: Self-pay | Admitting: Nurse Practitioner

## 2021-07-16 NOTE — Telephone Encounter (Signed)
Left message to f/u as needed

## 2021-07-20 ENCOUNTER — Ambulatory Visit
Admission: RE | Admit: 2021-07-20 | Discharge: 2021-07-20 | Disposition: A | Discharge status: Home / Self Care | Payer: BC Managed Care – PPO | Attending: Nurse Practitioner | Admitting: Nurse Practitioner

## 2021-07-20 ENCOUNTER — Ambulatory Visit
Admission: RE | Admit: 2021-07-20 | Discharge: 2021-07-20 | Disposition: A | Discharge status: Home / Self Care | Payer: BC Managed Care – PPO | Source: Ambulatory Visit | Attending: Nurse Practitioner | Admitting: Nurse Practitioner

## 2021-07-20 DIAGNOSIS — R918 Other nonspecific abnormal finding of lung field: Secondary | ICD-10-CM | POA: Diagnosis not present

## 2021-07-20 DIAGNOSIS — J984 Other disorders of lung: Secondary | ICD-10-CM | POA: Diagnosis not present

## 2021-07-20 DIAGNOSIS — R0602 Shortness of breath: Secondary | ICD-10-CM

## 2021-07-20 DIAGNOSIS — F411 Generalized anxiety disorder: Secondary | ICD-10-CM | POA: Diagnosis not present

## 2021-07-20 DIAGNOSIS — E538 Deficiency of other specified B group vitamins: Secondary | ICD-10-CM | POA: Diagnosis not present

## 2021-07-20 DIAGNOSIS — F410 Panic disorder [episodic paroxysmal anxiety] without agoraphobia: Secondary | ICD-10-CM | POA: Diagnosis not present

## 2021-07-20 DIAGNOSIS — K219 Gastro-esophageal reflux disease without esophagitis: Secondary | ICD-10-CM | POA: Diagnosis not present

## 2021-07-21 ENCOUNTER — Encounter: Payer: Self-pay | Admitting: Nurse Practitioner

## 2021-07-30 ENCOUNTER — Other Ambulatory Visit: Payer: Self-pay | Admitting: Nurse Practitioner

## 2021-07-30 DIAGNOSIS — R059 Cough, unspecified: Secondary | ICD-10-CM

## 2021-07-30 DIAGNOSIS — J4521 Mild intermittent asthma with (acute) exacerbation: Secondary | ICD-10-CM

## 2021-07-30 DIAGNOSIS — J22 Unspecified acute lower respiratory infection: Secondary | ICD-10-CM

## 2021-08-05 ENCOUNTER — Ambulatory Visit: Payer: BC Managed Care – PPO

## 2021-08-05 ENCOUNTER — Other Ambulatory Visit: Payer: Self-pay

## 2021-08-05 DIAGNOSIS — Z23 Encounter for immunization: Secondary | ICD-10-CM

## 2021-09-01 DIAGNOSIS — Z23 Encounter for immunization: Secondary | ICD-10-CM | POA: Diagnosis not present

## 2021-09-03 ENCOUNTER — Other Ambulatory Visit: Payer: Self-pay

## 2021-09-03 ENCOUNTER — Encounter: Payer: Self-pay | Admitting: Nurse Practitioner

## 2021-09-03 ENCOUNTER — Ambulatory Visit: Payer: BC Managed Care – PPO | Admitting: Nurse Practitioner

## 2021-09-03 DIAGNOSIS — T881XXA Other complications following immunization, not elsewhere classified, initial encounter: Secondary | ICD-10-CM

## 2021-09-03 NOTE — Progress Notes (Signed)
   Subjective:    Patient ID: April Hoffman, female    DOB: 06/14/1981, 40 y.o.   MRN: [id number]  HPI  40 year old female had her COVID booster 09/01/21  Underarm is very swollen today in armpit area.  She took 400mg  ibuprofen when getting home from work about 40 minutes ago Did not have any medication prior to going to work Arm was very sore throughout the day, denies numbness or tingling into hands/arm   Lymph node swelling prior reaction from COVID vaccine that resolved within 48 hours She had COVID August of this year.       Objective:   Physical Exam This was a telehealth appointment no physical exam was performed        Assessment & Plan:   1. Local reaction to COVID-19 vaccine Advised 600mg  ibuprofen every 6 hours with food  If swelling continues or with any additional reaction follow up in clinic Seek immediate medical attention for any numbness tingling in extremity or new worsening symptoms overnight as discussed

## 2021-09-04 ENCOUNTER — Ambulatory Visit: Payer: BC Managed Care – PPO | Admitting: Nurse Practitioner

## 2021-09-04 ENCOUNTER — Telehealth: Payer: Self-pay | Admitting: Nurse Practitioner

## 2021-09-04 DIAGNOSIS — T50Z95D Adverse effect of other vaccines and biological substances, subsequent encounter: Secondary | ICD-10-CM

## 2021-09-04 NOTE — Telephone Encounter (Signed)
Calling with update on arm. Took 600mg  ibuprofen last night with improvement in pain Feels unchanged this am.   Wanting instruction on how often she can take high dose Advil.

## 2021-09-04 NOTE — Progress Notes (Signed)
Spoke with patient on phone for update regarding axillary swelling after COVID booster. She feels overall the same today. She is currently off campus due to weather.  Plans to continue ibuprofen up to 600mg  every 6 hours with food for the next 1-2 days as needed.   She wills schedule a follow up in clinic if swelling continues beyond that time, or will seek immediate medical attention for any acutely worsening symptoms

## 2021-09-22 DIAGNOSIS — F411 Generalized anxiety disorder: Secondary | ICD-10-CM | POA: Diagnosis not present

## 2021-09-22 DIAGNOSIS — F4011 Social phobia, generalized: Secondary | ICD-10-CM | POA: Diagnosis not present

## 2021-09-22 DIAGNOSIS — F330 Major depressive disorder, recurrent, mild: Secondary | ICD-10-CM | POA: Diagnosis not present

## 2021-09-22 DIAGNOSIS — F5105 Insomnia due to other mental disorder: Secondary | ICD-10-CM | POA: Diagnosis not present

## 2021-10-05 ENCOUNTER — Encounter: Payer: Self-pay | Admitting: Medical

## 2021-10-05 ENCOUNTER — Other Ambulatory Visit: Payer: Self-pay

## 2021-10-05 ENCOUNTER — Ambulatory Visit: Payer: BC Managed Care – PPO | Admitting: Medical

## 2021-10-05 VITALS — BP 104/74 | HR 76 | Temp 97.6°F | Resp 16

## 2021-10-05 DIAGNOSIS — Z20822 Contact with and (suspected) exposure to covid-19: Secondary | ICD-10-CM

## 2021-10-05 DIAGNOSIS — B349 Viral infection, unspecified: Secondary | ICD-10-CM

## 2021-10-05 DIAGNOSIS — R6889 Other general symptoms and signs: Secondary | ICD-10-CM

## 2021-10-05 LAB — POCT INFLUENZA A/B
Influenza A, POC: NEGATIVE
Influenza B, POC: NEGATIVE

## 2021-10-05 LAB — POC COVID19 BINAXNOW: SARS Coronavirus 2 Ag: NEGATIVE

## 2021-10-05 NOTE — Progress Notes (Signed)
Subjective:    Patient ID: April Hoffman, female    DOB: [birth year], 40 y.o.   MRN: [id number]  HPI 40 yo female in non acute distress, had close contact exposure to a person who was positive for Covid -19.Mar[place] Kitchen Husband had exposure  to his supervisor on Tuesday and then Thursday but he is fine.She would like to be tered.  Mucus is clear from the nose.   .Blood pressure 104/74, pulse 76, temperature 97.6 F (36.4 C), temperature source Tympanic, resp. rate 16, SpO2 97 %.   Allergies  Allergen Reactions   Coconut Fatty Acids    Coconut Oil     All coconut    Elemental Sulfur     Vomiting after first dose    Sulfa Antibiotics      Review of Systems  Constitutional:  Positive for fatigue. Negative for chills and fever.  HENT:  Positive for congestion and sore throat. Negative for ear pain.   Respiratory:  Negative for cough, shortness of breath and wheezing.   Cardiovascular:  Negative for chest pain.  Gastrointestinal:  Positive for diarrhea (dfuring the weekend alightly better Sunday) and nausea. Negative for abdominal pain and vomiting.  Genitourinary:  Positive for dysuria (known nothing new). Negative for difficulty urinating.  Musculoskeletal:  Negative for myalgias.  Skin:  Negative for color change and rash.  Allergic/Immunologic: Positive for environmental allergies and food allergies.  Neurological:  Positive for headaches. Negative for dizziness, syncope and light-headedness.  Hematological:  Negative for adenopathy.  Psychiatric/Behavioral:  Negative for sleep disturbance.       Objective:   Physical Exam Vitals and nursing note reviewed.  Constitutional:      Appearance: Normal appearance.  HENT:     Head: Normocephalic and atraumatic.     Right Ear: Ear canal and external ear normal. A middle ear effusion is present.     Left Ear: Ear canal and external ear normal. A middle ear effusion is present.     Nose: Congestion present.     Mouth/Throat:     Mouth:  Mucous membranes are moist.     Pharynx: Oropharynx is clear. No oropharyngeal exudate or posterior oropharyngeal erythema.     Comments: Mild uvula edema Eyes:     Extraocular Movements: Extraocular movements intact.     Conjunctiva/sclera: Conjunctivae normal.     Pupils: Pupils are equal, round, and reactive to light.  Cardiovascular:     Rate and Rhythm: Normal rate and regular rhythm.     Heart sounds: Normal heart sounds. No murmur heard.   No friction rub. No gallop.  Pulmonary:     Effort: Pulmonary effort is normal.     Breath sounds: Normal breath sounds.  Musculoskeletal:        General: Normal range of motion.     Cervical back: Normal range of motion and neck supple.  Lymphadenopathy:     Cervical: No cervical adenopathy.  Skin:    General: Skin is warm and dry.  Neurological:     General: No focal deficit present.     Mental Status: She is alert and oriented to person, place, and time.  Psychiatric:        Mood and Affect: Mood normal.        Behavior: Behavior normal.        Thought Content: Thought content normal.    Results for orders placed or performed in visit on 10/05/21 (from the past 24 hour(s))  POC COVID-19  Status: Normal   Collection Time: 10/05/21 11:52 AM  Result Value Ref Range   SARS Coronavirus 2 Ag Negative Negative  POCT Influenza A/B     Status: Normal   Collection Time: 10/05/21 11:52 AM  Result Value Ref Range   Influenza A, POC Negative Negative   Influenza B, POC Negative Negative         Assessment & Plan:  Viral infection  Eustachain tube dysfunction bilateral Rest , increase fluids, OTC Motrin or Tylenol as needed for pain or fever. Follow up in 3-5 days if not improving here or with your doctor. Recommend Mucinex plain, and OTC Zyrtec to help with ear congestion. Patient verbalizes understanding and has no questions at discharge. Declines work note.

## 2021-10-05 NOTE — Patient Instructions (Signed)
Eustachian Tube Dysfunction °Eustachian tube dysfunction refers to a condition in which a blockage develops in the narrow passage that connects the middle ear to the back of the nose (eustachian tube). The eustachian tube regulates air pressure in the middle ear by letting air move between the ear and nose. It also helps to drain fluid from the middle ear space. °Eustachian tube dysfunction can affect one or both ears. When the eustachian tube does not function properly, air pressure, fluid, or both can build up in the middle ear. °What are the causes? °This condition occurs when the eustachian tube becomes blocked or cannot open normally. Common causes of this condition include: °Ear infections. °Colds and other infections that affect the nose, mouth, and throat (upper respiratory tract). °Allergies. °Irritation from cigarette smoke. °Irritation from stomach acid coming up into the esophagus (gastroesophageal reflux). The esophagus is the part of the body that moves food from the mouth to the stomach. °Sudden changes in air pressure, such as from descending in an airplane or scuba diving. °Abnormal growths in the nose or throat, such as: °Growths that line the nose (nasal polyps). °Abnormal growth of cells (tumors). °Enlarged tissue at the back of the throat (adenoids). °What increases the risk? °You are more likely to develop this condition if: °You smoke. °You are overweight. °You are a child who has: °Certain birth defects of the mouth, such as cleft palate. °Large tonsils or adenoids. °What are the signs or symptoms? °Common symptoms of this condition include: °A feeling of fullness in the ear. °Ear pain. °Clicking or popping noises in the ear. °Ringing in the ear (tinnitus). °Hearing loss. °Loss of balance. °Dizziness. °Symptoms may get worse when the air pressure around you changes, such as when you travel to an area of high elevation, fly on an airplane, or go scuba diving. °How is this diagnosed? °This  condition may be diagnosed based on: °Your symptoms. °A physical exam of your ears, nose, and throat. °Tests, such as those that measure: °The movement of your eardrum. °Your hearing (audiometry). °How is this treated? °Treatment depends on the cause and severity of your condition. °In mild cases, you may relieve your symptoms by moving air into your ears. This is called "popping the ears." °In more severe cases, or if you have symptoms of fluid in your ears, treatment may include: °Medicines to relieve congestion (decongestants). °Medicines that treat allergies (antihistamines). °Nasal sprays or ear drops that contain medicines that reduce swelling (steroids). °A procedure to drain the fluid in your eardrum. In this procedure, a small tube may be placed in the eardrum to: °Drain the fluid. °Restore the air in the middle ear space. °A procedure to insert a balloon device through the nose to inflate the opening of the eustachian tube (balloon dilation). °Follow these instructions at home: °Lifestyle °Do not do any of the following until your health care provider approves: °Travel to high altitudes. °Fly in airplanes. °Work in a pressurized cabin or room. °Scuba dive. °Do not use any products that contain nicotine or tobacco. These products include cigarettes, chewing tobacco, and vaping devices, such as e-cigarettes. If you need help quitting, ask your health care provider. °Keep your ears dry. Wear fitted earplugs during showering and bathing. Dry your ears completely after. °General instructions °Take over-the-counter and prescription medicines only as told by your health care provider. °Use techniques to help pop your ears as recommended by your health care provider. These may include: °Chewing gum. °Yawning. °Frequent, forceful swallowing. °Closing   your mouth, holding your nose closed, and gently blowing as if you are trying to blow air out of your nose. °Keep all follow-up visits. This is important. °Contact a  health care provider if: °Your symptoms do not go away after treatment. °Your symptoms come back after treatment. °You are unable to pop your ears. °You have: °A fever. °Pain in your ear. °Pain in your head or neck. °Fluid draining from your ear. °Your hearing suddenly changes. °You become very dizzy. °You lose your balance. °Get help right away if: °You have a sudden, severe increase in any of your symptoms. °Summary °Eustachian tube dysfunction refers to a condition in which a blockage develops in the eustachian tube. °It can be caused by ear infections, allergies, inhaled irritants, or abnormal growths in the nose or throat. °Symptoms may include ear pain or fullness, hearing loss, or ringing in the ears. °Mild cases are treated with techniques to unblock the ears, such as yawning or chewing gum. °More severe cases are treated with medicines or procedures. °This information is not intended to replace advice given to you by your health care provider. Make sure you discuss any questions you have with your health care provider. °Document Revised: 12/22/2020 Document Reviewed: 12/22/2020 °Elsevier Patient Education © 2022 [employer] ° °

## 2021-12-30 ENCOUNTER — Other Ambulatory Visit: Payer: Self-pay | Admitting: Internal Medicine

## 2021-12-30 ENCOUNTER — Other Ambulatory Visit: Payer: Self-pay | Admitting: Certified Nurse Midwife

## 2021-12-30 DIAGNOSIS — Z1231 Encounter for screening mammogram for malignant neoplasm of breast: Secondary | ICD-10-CM

## 2022-01-05 ENCOUNTER — Encounter: Payer: Self-pay | Admitting: Medical

## 2022-01-05 ENCOUNTER — Ambulatory Visit: Payer: BC Managed Care – PPO | Admitting: Medical

## 2022-01-05 ENCOUNTER — Other Ambulatory Visit: Payer: Self-pay

## 2022-01-05 VITALS — BP 124/78 | HR 64 | Temp 97.1°F | Resp 16

## 2022-01-05 DIAGNOSIS — R0982 Postnasal drip: Secondary | ICD-10-CM

## 2022-01-05 DIAGNOSIS — J029 Acute pharyngitis, unspecified: Secondary | ICD-10-CM

## 2022-01-05 DIAGNOSIS — J301 Allergic rhinitis due to pollen: Secondary | ICD-10-CM

## 2022-01-05 LAB — POCT RAPID STREP A (OFFICE): Rapid Strep A Screen: NEGATIVE

## 2022-01-05 MED ORDER — MONTELUKAST SODIUM 10 MG PO TABS: 10.0000 mg | ORAL_TABLET | Freq: Every day | ORAL | 0 refills | Status: DC

## 2022-01-19 ENCOUNTER — Other Ambulatory Visit: Payer: Self-pay | Admitting: Nurse Practitioner

## 2022-01-19 DIAGNOSIS — R0982 Postnasal drip: Secondary | ICD-10-CM

## 2022-01-19 DIAGNOSIS — J029 Acute pharyngitis, unspecified: Secondary | ICD-10-CM

## 2022-01-19 DIAGNOSIS — J301 Allergic rhinitis due to pollen: Secondary | ICD-10-CM

## 2022-01-19 MED ORDER — MONTELUKAST SODIUM 10 MG PO TABS: 10.0000 mg | ORAL_TABLET | Freq: Every day | ORAL | 0 refills | Status: DC

## 2022-01-19 NOTE — Progress Notes (Signed)
Singulair refill

## 2022-01-21 ENCOUNTER — Ambulatory Visit: Payer: BC Managed Care – PPO | Admitting: Family

## 2022-01-21 ENCOUNTER — Encounter: Payer: Self-pay | Admitting: Nurse Practitioner

## 2022-01-21 VITALS — BP 100/74 | HR 71 | Temp 98.2°F | Resp 16

## 2022-01-21 DIAGNOSIS — R051 Acute cough: Secondary | ICD-10-CM

## 2022-01-21 DIAGNOSIS — J4521 Mild intermittent asthma with (acute) exacerbation: Secondary | ICD-10-CM

## 2022-01-21 DIAGNOSIS — R0981 Nasal congestion: Secondary | ICD-10-CM

## 2022-01-21 DIAGNOSIS — Z20822 Contact with and (suspected) exposure to covid-19: Secondary | ICD-10-CM

## 2022-01-21 DIAGNOSIS — J069 Acute upper respiratory infection, unspecified: Secondary | ICD-10-CM

## 2022-01-21 DIAGNOSIS — Z1152 Encounter for screening for COVID-19: Secondary | ICD-10-CM

## 2022-01-21 DIAGNOSIS — J22 Unspecified acute lower respiratory infection: Secondary | ICD-10-CM

## 2022-01-21 LAB — POCT INFLUENZA A/B
Influenza A, POC: NEGATIVE
Influenza B, POC: NEGATIVE

## 2022-01-21 LAB — POC COVID19 BINAXNOW: SARS Coronavirus 2 Ag: NEGATIVE

## 2022-01-21 MED ORDER — PREDNISONE 10 MG PO TABS: 40.0000 mg | ORAL_TABLET | Freq: Every day | ORAL | 0 refills | Status: AC

## 2022-01-21 MED ORDER — ALBUTEROL SULFATE HFA 108 (90 BASE) MCG/ACT IN AERS: INHALATION_SPRAY | RESPIRATORY_TRACT | 1 refills | Status: DC

## 2022-01-21 MED ORDER — AZITHROMYCIN 250 MG PO TABS: ORAL_TABLET | ORAL | 0 refills | Status: AC

## 2022-01-21 NOTE — Progress Notes (Signed)
?[facility] ?[address] ?[place], [place] [address] ? ? ?Office Visit Note ? ?Patient Name: April Hoffman ? [id number]  ?[id number] ? ?Date of Service: 01/21/2022 ? ?Chief Complaint  ?Patient presents with  ? Nasal Congestion  ? Cough  ?  Patient reports head/nasal congestion with cough x 1 day. Her husband has been sick x 5 days and was dx bronchitis yesterday. Patient not sure if she has covid, flu, cold or allergies, but is feeling miserable. Taking otc zyrtec and dayquil for sx.  ? ? ? ?Cough ?Associated symptoms include postnasal drip. Pertinent negatives include no chills or fever.  ?Pt is here for a sick visit with c/o acute sinus/cough sxs that started yesterday.  Denies fever. Otc Dayquil, Flonase and Zyrtec.  Allergies have been stable with Zyrtec and Flonase until these new sxs started. Hx of Covid 19 in 07/2021. ? ? ? ? ?Current Medication: ? ?Outpatient Encounter Medications as of 01/21/2022  ?Medication Sig Note  ? azithromycin (ZITHROMAX) 250 MG tablet Take 2 tablets on day 1, then 1 tablet daily on days 2 through 5   ? busPIRone (BUSPAR) 5 MG tablet Take 1 tablet by mouth 2 (two) times daily.   ? cetirizine (ZYRTEC) 10 MG tablet Take 10 mg by mouth daily.   ? hydrOXYzine (ATARAX/VISTARIL) 25 MG tablet Take 1 tablet (25 mg total) by mouth 3 (three) times daily.   ? predniSONE (DELTASONE) 10 MG tablet Take 4 tablets (40 mg total) by mouth daily for 5 days.   ? albuterol (VENTOLIN HFA) 108 (90 Base) MCG/ACT inhaler TAKE 2 PUFFS BY MOUTH EVERY 6 HOURS AS NEEDED FOR WHEEZE OR SHORTNESS OF BREATH   ? benzonatate (TESSALON) 100 MG capsule Take 1 capsule (100 mg total) by mouth 3 (three) times daily as needed for cough. (Patient not taking: Reported on 01/21/2022)   ? etonogestrel (NEXPLANON) 68 MG IMPL implant Inject 1 each into the skin once as needed. 09/16/2020: Implanted 2 years ago  ? montelukast (SINGULAIR) 10 MG tablet Take 1 tablet (10 mg total) by mouth at bedtime. (Patient not taking:  Reported on 01/21/2022)   ? VITAMIN D, CHOLECALCIFEROL, PO Take 5,000 Units by mouth daily.   ? [DISCONTINUED] albuterol (VENTOLIN HFA) 108 (90 Base) MCG/ACT inhaler TAKE 2 PUFFS BY MOUTH EVERY 6 HOURS AS NEEDED FOR WHEEZE OR SHORTNESS OF BREATH (Patient not taking: Reported on 01/05/2022)   ? ?No facility-administered encounter medications on file as of 01/21/2022.  ? ? ? ? ?Medical History: ?No past medical history on file. ? ? ?Vital Signs: ?BP 100/74 (BP Location: Left Arm, Patient Position: Sitting, Cuff Size: Normal)   Pulse 71   Temp 98.2 ?F (36.8 ?C) (Tympanic)   Resp 16   SpO2 99%  ? ? ?Review of Systems  ?Constitutional:  Positive for activity change and fatigue. Negative for chills and fever.  ?HENT:  Positive for congestion, postnasal drip and sinus pressure. Negative for sinus pain.   ?Respiratory:  Positive for cough.   ?Gastrointestinal: Negative.   ? ?Physical Exam ?Constitutional:   ?   Appearance: Normal appearance.  ?HENT:  ?   Right Ear: Tympanic membrane, ear canal and external ear normal.  ?   Left Ear: Tympanic membrane, ear canal and external ear normal.  ?   Nose:  ?   Comments: Nasal mucosa pale and boggy ?   Mouth/Throat:  ?   Mouth: Mucous membranes are moist.  ?   Pharynx: Oropharynx is clear.  ?  Cardiovascular:  ?   Rate and Rhythm: Normal rate and regular rhythm.  ?   Heart sounds: Normal heart sounds.  ?Pulmonary:  ?   Effort: Pulmonary effort is normal.  ?   Breath sounds: Normal breath sounds.  ?   Comments: Mild bronchospasms during exam. ?Musculoskeletal:  ?   Cervical back: Normal range of motion.  ?Lymphadenopathy:  ?   Cervical: Cervical adenopathy present.  ?Skin: ?   General: Skin is warm.  ?Neurological:  ?   Mental Status: She is alert.  ? ? ? ?Assessment/Plan: ?Acute cough - Plan: predniSONE (DELTASONE) 10 MG tablet ? ?Encounter for screening laboratory testing for COVID-19 virus - Plan: POC COVID-19 ? ?Nasal congestion - Plan: POCT Influenza A/B ? ?Upper respiratory tract  infection, unspecified type - Plan: predniSONE (DELTASONE) 10 MG tablet, azithromycin (ZITHROMAX) 250 MG tablet ? ?Lower respiratory infection - Plan: albuterol (VENTOLIN HFA) 108 (90 Base) MCG/ACT inhaler ? ?Mild intermittent asthma with acute exacerbation - Plan: albuterol (VENTOLIN HFA) 108 (90 Base) MCG/ACT inhaler ? ?Reviewed viral and allergic exam findings with patient.  Will refill Albuterol HFA as she was helpful with her cough last year during Covid 19.  Also added short course of steroids to help decreased generalized sinus inflammation.  Hold Zpak for worsening sxs or onset of fever.  Cont otc allergy regimen.  Call or rtc with any further questions or concerns.  Covid 19 and Flu neg. ? ?Orders Placed This Encounter  ?Procedures  ? POC COVID-19  ? POCT Influenza A/B  ? ? ?Meds ordered this encounter  ?Medications  ? predniSONE (DELTASONE) 10 MG tablet  ?  Sig: Take 4 tablets (40 mg total) by mouth daily for 5 days.  ?  Dispense:  20 tablet  ?  Refill:  0  ? azithromycin (ZITHROMAX) 250 MG tablet  ?  Sig: Take 2 tablets on day 1, then 1 tablet daily on days 2 through 5  ?  Dispense:  6 tablet  ?  Refill:  0  ? albuterol (VENTOLIN HFA) 108 (90 Base) MCG/ACT inhaler  ?  Sig: TAKE 2 PUFFS BY MOUTH EVERY 6 HOURS AS NEEDED FOR WHEEZE OR SHORTNESS OF BREATH  ?  Dispense:  8.5 each  ?  Refill:  1  ? ? ?Janeece Agee, FNP-C ?

## 2022-01-25 NOTE — Progress Notes (Signed)
? ?  Subjective:  ? ? Patient ID: April Hoffman, female    DOB: 11-05-1980, 41 y.o.   MRN: [id number] ? ?HPI ?41 yo female in non acute distress with nasal congestion, sore throat x 2-3 days.  ? ? ?Blood pressure 124/78, pulse 64, temperature (!) 97.1 ?F (36.2 ?C), temperature source Tympanic, resp. rate 16, SpO2 99 %. ? ? ?Review of Systems  ?Constitutional:  Negative for chills and fever.  ?HENT:  Positive for congestion, postnasal drip, rhinorrhea, sinus pressure, sinus pain and sore throat.   ?Respiratory:  Positive for cough (mild).   ? ?   ?Objective:  ? Physical Exam ?Constitutional:   ?   Appearance: She is well-developed and normal weight.  ?HENT:  ?   Head: Normocephalic and atraumatic.  ?   Right Ear: Tympanic membrane and ear canal normal.  ?   Left Ear: Tympanic membrane and ear canal normal.  ?   Mouth/Throat:  ?   Mouth: Mucous membranes are moist.  ?   Pharynx: Oropharynx is clear. Uvula midline.  ?   Tonsils: No tonsillar exudate or tonsillar abscesses. 0 on the right. 0 on the left.  ?Eyes:  ?   Conjunctiva/sclera: Conjunctivae normal.  ?   Pupils: Pupils are equal, round, and reactive to light.  ?Cardiovascular:  ?   Rate and Rhythm: Normal rate and regular rhythm.  ?Pulmonary:  ?   Effort: Pulmonary effort is normal.  ?   Breath sounds: Normal breath sounds.  ?Abdominal:  ?   Palpations: Abdomen is soft.  ?Musculoskeletal:  ?   Cervical back: Normal range of motion and neck supple.  ?Skin: ?   General: Skin is warm and dry.  ?   Capillary Refill: Capillary refill takes less than 2 seconds.  ?Neurological:  ?   Mental Status: She is alert.  ? ? ?Recent Results (from the past 2160 hour(s))  ?POCT rapid strep A     Status: Normal  ? Collection Time: 01/05/22 10:50 AM  ?Result Value Ref Range  ? Rapid Strep A Screen Negative Negative  ?POC COVID-19     Status: Normal  ? Collection Time: 01/21/22  8:54 AM  ?Result Value Ref Range  ? SARS Coronavirus 2 Ag Negative Negative  ?  Comment: Patient vaccinated  x 2 and boosted 1 or 2x for covid. Has had one prior covid infection.  ?POCT Influenza A/B     Status: Normal  ? Collection Time: 01/21/22  8:56 AM  ?Result Value Ref Range  ? Influenza A, POC Negative Negative  ?  Comment: patient received flu vaccine  ? Influenza B, POC Negative Negative  ?  Comment: patient received flu vaccine  ?  ? ? ?   ?Assessment & Plan:  ?Seasonal rhinitis due to pollen ?Post nasal drip ?Sore throat. ?Meds ordered this encounter  ?Medications  ? DISCONTD: montelukast (SINGULAIR) 10 MG tablet  ?  Sig: Take 1 tablet (10 mg total) by mouth at bedtime.  ?  Dispense:  30 tablet  ?  Refill:  0  ?  ?Follow up with your pcp if not improving in 3-5 days.Patient verbalizes understanding and has no questions at discharge. ? ?

## 2022-01-25 NOTE — Patient Instructions (Signed)
Allergies, Adult ?An allergy is a condition in which the body's defense system (immune system) comes in contact with an allergen and reacts to it. An allergen is anything that causes an allergic reaction. Allergens cause the immune system to make proteins for fighting infections (antibodies). These antibodies cause cells to release chemicals called histamines that set off the symptoms of an allergic reaction. ?Allergies often affect the nasal passages (allergic rhinitis), eyes (allergic conjunctivitis), skin (atopic dermatitis), and stomach. Allergies can be mild, moderate, or severe. They cannot spread from person to person. Allergies can develop at any age and may be outgrown. ?What are the causes? ?This condition is caused by allergens. Common allergens include: ?Outdoor allergens, such as pollen, car fumes, and mold. ?Indoor allergens, such as dust, smoke, mold, and pet dander. ?Other allergens, such as foods, medicines, scents, insect bites or stings, and other skin irritants. ?What increases the risk? ?You are more likely to develop this condition if you have: ?Family members with allergies. ?Family members who have any condition that may be caused by allergens, such as asthma. This may make you more likely to have other allergies. ?What are the signs or symptoms? ?Symptoms of this condition depend on the severity of the allergy. ?Mild to moderate symptoms ?Runny nose, stuffy nose (nasal congestion), or sneezing. ?Itchy mouth, ears, or throat. ?A feeling of mucus dripping down the back of your throat (postnasal drip). ?Sore throat. ?Itchy, red, watery, or puffy eyes. ?Skin rash, or itchy, red, swollen areas of skin (hives). ?Stomach cramps or bloating. ?Severe symptoms ?Severe allergies to food, medicine, or insect bites may cause anaphylaxis, which can be life-threatening. Symptoms include: ?A red (flushed) face. ?Wheezing or coughing. ?Swollen lips, tongue, or mouth. ?Tight or swollen throat. ?Chest pain or  tightness, or rapid heartbeat. ?Trouble breathing or shortness of breath. ?Pain in the abdomen, vomiting, or diarrhea. ?Dizziness or fainting. ?How is this diagnosed? ?This condition is diagnosed based on your symptoms, your family and medical history, and a physical exam. You may also have tests, including: ?Skin tests to see how your skin reacts to allergens that may be causing your symptoms. Tests include: ?Skin prick test. For this test, an allergen is introduced to your body through a small opening in the skin. ?Intradermal skin test. For this test, a small amount of allergen is injected under the first layer of your skin. ?Patch test. For this test, a small amount of allergen is placed on your skin. The area is covered and then checked after a few days. ?Blood tests. ?A challenge test. For this test, you will eat or breathe in a small amount of allergen to see if you have an allergic reaction. ?You may also be asked to: ?Keep a food diary. This is a record of all the foods, drinks, and symptoms you have in a day. ?Try an elimination diet. To do this: ?Remove certain foods from your diet. ?Add those foods back one by one to find out if any foods cause an allergic reaction. ?How is this treated? ?  ?Treatment for allergies depends on your symptoms. Treatment may include: ?Cold, wet cloths (cold compresses) to soothe itching and swelling. ?Eye drops or nasal sprays. ?Nasal irrigation to help clear your mucus or keep the nasal passages moist. ?A humidifier to add moisture to the air. ?Skin creams to treat rashes or itching. ?Oral antihistamines or other medicines to block the reaction or to treat inflammation. ?Diet changes to remove foods that cause allergies. ?Being exposed again   and again to tiny amounts of allergens to help you build a defense against it (tolerance). This is called immunotherapy. Examples include: ?Allergy shot. You receive an injection that contains an allergen. ?Sublingual immunotherapy. You  take a small dose of allergen under your tongue. ?Emergency injection for anaphylaxis. You give yourself a shot using a syringe (auto-injector) that contains the amount of medicine you need. Your health care provider will teach you how to give yourself an injection. ?Follow these instructions at home: ?Medicines ? ?Take or apply over-the-counter and prescription medicines only as told by your health care provider. ?Always carry your auto-injector pen if you are at risk of anaphylaxis. Give yourself an injection as told by your health care provider. ?Eating and drinking ?Follow instructions from your health care provider about eating or drinking restrictions. ?Drink enough fluid to keep your urine pale yellow. ?General instructions ?Wear a medical alert bracelet or necklace to let others know that you have had anaphylaxis before. ?Avoid known allergens whenever possible. ?Keep all follow-up visits as told by your health care provider. This is important. ?Contact a health care provider if: ?Your symptoms do not get better with treatment. ?Get help right away if: ?You have symptoms of anaphylaxis. These include: ?Swollen mouth, tongue, or throat. ?Pain or tightness in your chest. ?Trouble breathing or shortness of breath. ?Dizziness or fainting. ?Severe abdominal pain, vomiting, or diarrhea. ?These symptoms may represent a serious problem that is an emergency. Do not wait to see if the symptoms will go away. Get medical help right away. Call your local emergency services (911 in the [place] Do not drive yourself to the hospital. ?Summary ?Take or apply over-the-counter and prescription medicines only as told by your health care provider. ?Avoid known allergens when possible. ?Always carry your auto-injector pen if you are at risk of anaphylaxis. Give yourself an injection as told by your health care provider. ?Wear a medical alert bracelet or necklace to let others know that you have had anaphylaxis before. ?Anaphylaxis  is a life-threatening emergency. Get help right away. ?This information is not intended to replace advice given to you by your health care provider. Make sure you discuss any questions you have with your health care provider. ?Document Revised: 06/09/2020 Document Reviewed: 08/22/2019 ?Elsevier Patient Education ? 2022 [employer] ? ?

## 2022-02-05 ENCOUNTER — Ambulatory Visit
Admission: RE | Admit: 2022-02-05 | Discharge: 2022-02-05 | Disposition: A | Discharge status: Home / Self Care | Payer: BC Managed Care – PPO | Source: Ambulatory Visit | Attending: Certified Nurse Midwife | Admitting: Certified Nurse Midwife

## 2022-02-05 DIAGNOSIS — Z1231 Encounter for screening mammogram for malignant neoplasm of breast: Secondary | ICD-10-CM | POA: Diagnosis not present

## 2022-02-10 ENCOUNTER — Ambulatory Visit: Payer: BC Managed Care – PPO | Admitting: Medical

## 2022-02-10 ENCOUNTER — Encounter: Payer: Self-pay | Admitting: Medical

## 2022-02-10 VITALS — BP 112/80 | HR 85 | Temp 97.7°F | Resp 16

## 2022-02-10 DIAGNOSIS — T63441A Toxic effect of venom of bees, accidental (unintentional), initial encounter: Secondary | ICD-10-CM

## 2022-02-10 MED ORDER — PREDNISONE 10 MG (21) PO TBPK: ORAL_TABLET | ORAL | 0 refills | Status: DC

## 2022-02-10 MED ORDER — AMOXICILLIN-POT CLAVULANATE 875-125 MG PO TABS: 1.0000 | ORAL_TABLET | Freq: Two times a day (BID) | ORAL | 0 refills | Status: DC

## 2022-02-10 NOTE — Progress Notes (Signed)
? ?  Subjective:  ? ? Patient ID: April Hoffman, female    DOB: 09-05-1981, 41 y.o.   MRN: [id number] ? ?HPI ?41 yo female in non acute distress, with bee sting behind right ear on [place]. ?Has been taking Benadryl every 3-4 hours ( 50 mg), applying ice and hydrocortisone to the site with minimal relief. Area is painful and itchy. ? ? ?Review of Systems  ?HENT:    ?     Sting behind ear, erythema ,  uticarial ?  ? ?   ?Objective:  ? Physical Exam ?Constitutional:   ?   Appearance: Normal appearance.  ?HENT:  ?   Mouth/Throat:  ?   Mouth: Mucous membranes are moist.  ?   Pharynx: Oropharynx is clear.  ?   Comments: No throat swelling ?Eyes:  ?   Extraocular Movements: Extraocular movements intact.  ?   Conjunctiva/sclera: Conjunctivae normal.  ?   Pupils: Pupils are equal, round, and reactive to light.  ?Skin: ?   General: Skin is warm and dry.  ?   Findings: Erythema (mild eryethema, and mild edema stretching down into upper neck right side.positive adenopathy upper neck) present.  ?Neurological:  ?   General: No focal deficit present.  ?   Mental Status: She is alert and oriented to person, place, and time.  ?Psychiatric:     ?   Mood and Affect: Mood normal.     ?   Behavior: Behavior normal.     ?   Thought Content: Thought content normal.     ?   Judgment: Judgment normal.  ? ? ? ? ? ?   ?Assessment & Plan:  ?Bee sting behind right ear ?Meds ordered this encounter  ?Medications  ? predniSONE (STERAPRED UNI-PAK 21 TAB) 10 MG (21) TBPK tablet  ?  Sig: Take 6 tablets by mouth today then 5 tablets tomorrow then one less tablet everyday there after. Take with food.  ?  Dispense:  21 tablet  ?  Refill:  0  ? amoxicillin-clavulanate (AUGMENTIN) 875-125 MG tablet  ?  Sig: Take 1 tablet by mouth 2 (two) times daily.  ?  Dispense:  20 tablet  ?  Refill:  0  ?  ?Try OTC Motrin 800 mg  TID x 10 days with food. ?OTC Benadryl cream or gel to the site per package instructions. ?Continue Ice, hydrocortisone cream  BID, can  increase benadryl to  100 mg, recommend she titrate up at home in controlled area, so that if sedation occurs she can lay down, no to operate machinery or sharp objects. ?Patient verbalizes understanding and has no questions at discharge. ?Return to the clinic as needed. ?

## 2022-06-30 ENCOUNTER — Encounter: Payer: Self-pay | Admitting: Nurse Practitioner

## 2022-06-30 ENCOUNTER — Ambulatory Visit (INDEPENDENT_AMBULATORY_CARE_PROVIDER_SITE_OTHER): Payer: Self-pay | Admitting: Nurse Practitioner

## 2022-06-30 VITALS — BP 110/78 | HR 87 | Temp 98.6°F | Resp 18

## 2022-06-30 DIAGNOSIS — J069 Acute upper respiratory infection, unspecified: Secondary | ICD-10-CM

## 2022-06-30 LAB — POC COVID19 BINAXNOW: SARS Coronavirus 2 Ag: NEGATIVE

## 2022-06-30 NOTE — Progress Notes (Signed)
[facility] [address] [place], [place] [address] [phone]  Office Visit Note  Patient Name: April Hoffman Date of Birth [id number]  Medical Record number [id number]  Date of Service: 06/30/2022  HPI  41 year old female presenting to [facility] with complaints of runny nose, sore throat, headache and cough.   She has had COVID in the past and had a long recovery.   Current Medication:  Outpatient Encounter Medications as of 06/30/2022  Medication Sig Note   albuterol (VENTOLIN HFA) 108 (90 Base) MCG/ACT inhaler TAKE 2 PUFFS BY MOUTH EVERY 6 HOURS AS NEEDED FOR WHEEZE OR SHORTNESS OF BREATH (Patient not taking: Reported on 02/10/2022)    amoxicillin-clavulanate (AUGMENTIN) 875-125 MG tablet Take 1 tablet by mouth 2 (two) times daily.    benzonatate (TESSALON) 100 MG capsule Take 1 capsule (100 mg total) by mouth 3 (three) times daily as needed for cough. (Patient not taking: Reported on 01/21/2022)    busPIRone (BUSPAR) 7.5 MG tablet Take by mouth.    cetirizine (ZYRTEC) 10 MG tablet Take 10 mg by mouth daily.    DULoxetine (CYMBALTA) 30 MG capsule Take 30 mg by mouth daily.    estradiol (ESTRACE) 0.1 MG/GM vaginal cream Place vaginally.    estradiol (ESTRACE) 0.1 MG/GM vaginal cream SMARTSIG:0.25 Applicator Vaginal Twice a Week    etonogestrel (NEXPLANON) 68 MG IMPL implant Inject 1 each into the skin once as needed. 09/16/2020: Implanted 2 years ago   hydrOXYzine (ATARAX) 25 MG tablet Take by mouth.    hydrOXYzine (ATARAX/VISTARIL) 25 MG tablet Take 1 tablet (25 mg total) by mouth 3 (three) times daily.    montelukast (SINGULAIR) 10 MG tablet Take 1 tablet (10 mg total) by mouth at bedtime. (Patient not taking: Reported on 01/21/2022)    predniSONE (STERAPRED UNI-PAK 21 TAB) 10 MG (21) TBPK tablet Take 6 tablets by mouth today then 5 tablets tomorrow then one less tablet everyday there after. Take with food.    traZODone (DESYREL) 50 MG tablet Take 50 mg by  mouth at bedtime.    venlafaxine XR (EFFEXOR-XR) 75 MG 24 hr capsule Take 75 mg by mouth every morning.    VITAMIN D, CHOLECALCIFEROL, PO Take 5,000 Units by mouth daily.    No facility-administered encounter medications on file as of 06/30/2022.     Vital Signs: Today's Vitals   06/30/22 1121 06/30/22 1128  BP:  110/78  Pulse:  87  Resp: 18 18  Temp:  98.6 F (37 C)  TempSrc:  Tympanic  SpO2:  98%   There is no height or weight on file to calculate BMI.   Review of Systems  Constitutional:  Positive for fatigue.  HENT:  Positive for congestion, rhinorrhea and sore throat.   Neurological:  Positive for headaches.    Physical Exam HENT:     Head: Normocephalic.     Right Ear: Tympanic membrane, ear canal and external ear normal.     Left Ear: Tympanic membrane, ear canal and external ear normal.     Nose: Nose normal.     Mouth/Throat:     Mouth: Mucous membranes are moist.  Eyes:     Pupils: Pupils are equal, round, and reactive to light.  Cardiovascular:     Rate and Rhythm: Normal rate and regular rhythm.  Pulmonary:     Effort: Pulmonary effort is normal.  Musculoskeletal:     Cervical back: Normal range of motion.  Skin:  General: Skin is warm.     Capillary Refill: Capillary refill takes less than 2 seconds.  Neurological:     General: No focal deficit present.     Mental Status: She is alert.      Recent Results (from the past 2160 hour(s))  POC COVID-19     Status: Normal   Collection Time: 06/30/22 11:37 AM  Result Value Ref Range   SARS Coronavirus 2 Ag Negative Negative    Assessment/Plan: 1. Viral upper respiratory tract infection Plan to retest tomorrow, continue to mask treat symptoms with OTC medications    Will check GFR as patient is interested in Paxlovid given last course of Covid was complicated- if she were to test positive tomorrow will discuss at that time   - POC COVID-19 - Comprehensive metabolic panel    General  Counseling: Lakaisha verbalizes understanding of the findings of todays visit and agrees with plan of treatment. I have discussed any further diagnostic evaluation that may be needed or ordered today. We also reviewed her medications today. she has been encouraged to call the office with any questions or concerns that should arise related to todays visit.     Time spent:10 Minutes   Viviano Simas [facility] Nurse Practitioner

## 2022-07-01 ENCOUNTER — Ambulatory Visit: Payer: BC Managed Care – PPO | Admitting: Nurse Practitioner

## 2022-07-02 LAB — COMPREHENSIVE METABOLIC PANEL
ALT: 16 IU/L (ref 0–32)
AST: 17 IU/L (ref 0–40)
Albumin/Globulin Ratio: 2.3 — ABNORMAL HIGH (ref 1.2–2.2)
Albumin: 5 g/dL — ABNORMAL HIGH (ref 3.9–4.9)
Alkaline Phosphatase: 57 IU/L (ref 44–121)
BUN/Creatinine Ratio: 8 — ABNORMAL LOW (ref 9–23)
BUN: 6 mg/dL (ref 6–24)
Bilirubin Total: 0.4 mg/dL (ref 0.0–1.2)
CO2: 14 mmol/L — ABNORMAL LOW (ref 20–29)
Calcium: 9.9 mg/dL (ref 8.7–10.2)
Chloride: 104 mmol/L (ref 96–106)
Creatinine, Ser: 0.77 mg/dL (ref 0.57–1.00)
Globulin, Total: 2.2 g/dL (ref 1.5–4.5)
Glucose: 90 mg/dL (ref 70–99)
Potassium: 4.3 mmol/L (ref 3.5–5.2)
Sodium: 137 mmol/L (ref 134–144)
Total Protein: 7.2 g/dL (ref 6.0–8.5)
eGFR: 100 mL/min/{1.73_m2} (ref >59)

## 2022-12-24 ENCOUNTER — Ambulatory Visit (INDEPENDENT_AMBULATORY_CARE_PROVIDER_SITE_OTHER): Payer: BC Managed Care – PPO | Admitting: Adult Health

## 2022-12-24 DIAGNOSIS — M5441 Lumbago with sciatica, right side: Secondary | ICD-10-CM

## 2022-12-24 MED ORDER — CYCLOBENZAPRINE HCL 10 MG PO TABS: 10.0000 mg | ORAL_TABLET | Freq: Every day | ORAL | 0 refills | Status: DC

## 2022-12-24 MED ORDER — PREDNISONE 10 MG PO TABS: ORAL_TABLET | ORAL | 0 refills | Status: AC

## 2022-12-24 NOTE — Progress Notes (Signed)
[facility] [address], [place] [address]   Office Visit Note  Patient Name: April Hoffman Date of Birth [id number]  Medical Record number [id number]  Date of Service: 12/24/2022  No chief complaint on file.    HPI Pt is here for a sick visit. About two weeks ago she woke up with some low back and right hip pain.  She has a history of scoliosis, so hipt and back pain are not new for her.  She describes some cramping, and fire  in her right lower back and down her right leg. She sees a Restaurant manager, fast food, but he recommended steroids and or a muscle relaxer.  She is having pain with walking, and is unable to sit currently. Pacing in exam room.   Current Medication:  Outpatient Encounter Medications as of 12/24/2022  Medication Sig Note   cyclobenzaprine (FLEXERIL) 10 MG tablet Take 1 tablet (10 mg total) by mouth at bedtime.    predniSONE (DELTASONE) 10 MG tablet Take 6 tablets (60 mg total) by mouth daily with breakfast for 1 day, THEN 5 tablets (50 mg total) daily with breakfast for 1 day, THEN 4 tablets (40 mg total) daily with breakfast for 1 day, THEN 3 tablets (30 mg total) daily with breakfast for 1 day, THEN 2 tablets (20 mg total) daily with breakfast for 1 day, THEN 1 tablet (10 mg total) daily with breakfast for 1 day.    albuterol (VENTOLIN HFA) 108 (90 Base) MCG/ACT inhaler TAKE 2 PUFFS BY MOUTH EVERY 6 HOURS AS NEEDED FOR WHEEZE OR SHORTNESS OF BREATH (Patient not taking: Reported on 02/10/2022)    cetirizine (ZYRTEC) 10 MG tablet Take 10 mg by mouth daily.    Cholecalciferol 25 MCG (1000 UT) capsule Take by mouth.    cyanocobalamin (VITAMIN B12) 1000 MCG tablet Take by mouth.    estradiol (ESTRACE) 0.1 MG/GM vaginal cream Place vaginally.    estradiol (ESTRACE) 0.1 MG/GM vaginal cream [id number] Applicator Vaginal Twice a Week    etonogestrel (NEXPLANON) 68 MG IMPL implant Inject 1 each into the skin once as needed. 09/16/2020: Implanted 2 years ago    hydrOXYzine (ATARAX) 25 MG tablet Take by mouth.    hydrOXYzine (ATARAX/VISTARIL) 25 MG tablet Take 1 tablet (25 mg total) by mouth 3 (three) times daily.    VITAMIN D, CHOLECALCIFEROL, PO Take 5,000 Units by mouth daily.    No facility-administered encounter medications on file as of 12/24/2022.      Medical History: No past medical history on file.   Vital Signs: There were no vitals taken for this visit.   Review of Systems  Constitutional:  Negative for chills, fatigue and fever.  Musculoskeletal:  Positive for back pain and myalgias.    Physical Exam Vitals and nursing note reviewed.  Constitutional:      Appearance: She is ill-appearing.  Neurological:     General: No focal deficit present.     Mental Status: She is alert and oriented to person, place, and time.     Motor: Weakness present.     Gait: Gait abnormal.    Assessment/Plan: 1. Acute right-sided low back pain with right-sided sciatica Take prednisone as prescribed, and use muscle relaxers at night. Follow up via MyChart messenger if symptoms fail to improve or may return to clinic as needed for worsening symptoms.   - predniSONE (DELTASONE) 10 MG tablet; Take 6 tablets (60 mg total) by mouth daily with breakfast for 1 day, THEN 5 tablets (  50 mg total) daily with breakfast for 1 day, THEN 4 tablets (40 mg total) daily with breakfast for 1 day, THEN 3 tablets (30 mg total) daily with breakfast for 1 day, THEN 2 tablets (20 mg total) daily with breakfast for 1 day, THEN 1 tablet (10 mg total) daily with breakfast for 1 day.  Dispense: 21 tablet; Refill: 0 - cyclobenzaprine (FLEXERIL) 10 MG tablet; Take 1 tablet (10 mg total) by mouth at bedtime.  Dispense: 30 tablet; Refill: 0     General Counseling: Lealer verbalizes understanding of the findings of todays visit and agrees with plan of treatment. I have discussed any further diagnostic evaluation that may be needed or ordered today. We also reviewed her medications  today. she has been encouraged to call the office with any questions or concerns that should arise related to todays visit.   No orders of the defined types were placed in this encounter.   Meds ordered this encounter  Medications   predniSONE (DELTASONE) 10 MG tablet    Sig: Take 6 tablets (60 mg total) by mouth daily with breakfast for 1 day, THEN 5 tablets (50 mg total) daily with breakfast for 1 day, THEN 4 tablets (40 mg total) daily with breakfast for 1 day, THEN 3 tablets (30 mg total) daily with breakfast for 1 day, THEN 2 tablets (20 mg total) daily with breakfast for 1 day, THEN 1 tablet (10 mg total) daily with breakfast for 1 day.    Dispense:  21 tablet    Refill:  0   cyclobenzaprine (FLEXERIL) 10 MG tablet    Sig: Take 1 tablet (10 mg total) by mouth at bedtime.    Dispense:  30 tablet    Refill:  0    Time spent:15 Minutes    Kendell Bane AGNP-C Nurse Practitioner

## 2023-02-28 ENCOUNTER — Ambulatory Visit (INDEPENDENT_AMBULATORY_CARE_PROVIDER_SITE_OTHER): Payer: BC Managed Care – PPO | Admitting: Physician Assistant

## 2023-02-28 VITALS — BP 110/70 | HR 94 | Temp 97.9°F | Resp 16 | Ht 61.0 in | Wt 151.0 lb

## 2023-02-28 DIAGNOSIS — H938X1 Other specified disorders of right ear: Secondary | ICD-10-CM

## 2023-02-28 NOTE — Progress Notes (Signed)
[facility] [address] [place], [place] [address]   Office Visit Note  Patient Name: April Hoffman Date of Birth [id number]  Medical Record number [id number]  Date of Service: 02/28/2023  Chief Complaint  Patient presents with   Ear Fullness     42 y/o F experiences right ear fullness x 2 days. +h/o allergies for which she takes Zyrtec. She also currently takes Mucinex-DM to help with her symptoms, but doesn't feel like any different. Mild pressure over the face, but denies pain. No cough. +post nasal drainage. Denies fever, chills, CP, SOB, or wheezing. She denies recent open water activities or air travel. No nausea, vomiting, or dizziness.       Current Medication:  Outpatient Encounter Medications as of 02/28/2023  Medication Sig Note   albuterol (VENTOLIN HFA) 108 (90 Base) MCG/ACT inhaler TAKE 2 PUFFS BY MOUTH EVERY 6 HOURS AS NEEDED FOR WHEEZE OR SHORTNESS OF BREATH (Patient not taking: Reported on 02/10/2022)    cetirizine (ZYRTEC) 10 MG tablet Take 10 mg by mouth daily.    Cholecalciferol 25 MCG (1000 UT) capsule Take by mouth.    cyanocobalamin (VITAMIN B12) 1000 MCG tablet Take by mouth.    cyclobenzaprine (FLEXERIL) 10 MG tablet Take 1 tablet (10 mg total) by mouth at bedtime.    estradiol (ESTRACE) 0.1 MG/GM vaginal cream Place vaginally.    estradiol (ESTRACE) 0.1 MG/GM vaginal cream SMARTSIG:0.25 Applicator Vaginal Twice a Week    etonogestrel (NEXPLANON) 68 MG IMPL implant Inject 1 each into the skin once as needed. 09/16/2020: Implanted 2 years ago   hydrOXYzine (ATARAX) 25 MG tablet Take by mouth.    hydrOXYzine (ATARAX/VISTARIL) 25 MG tablet Take 1 tablet (25 mg total) by mouth 3 (three) times daily.    VITAMIN D, CHOLECALCIFEROL, PO Take 5,000 Units by mouth daily.    No facility-administered encounter medications on file as of 02/28/2023.      Medical History: No past medical history on file.   Vital Signs: BP 110/70 (BP Location: Left Arm,  Patient Position: Sitting, Cuff Size: Normal)   Pulse 94   Temp 97.9 F (36.6 C) (Tympanic)   Resp 16   Ht 5\' 1"  (1.549 m)   Wt 151 lb (68.5 kg)   SpO2 98%   BMI 28.53 kg/m    Review of Systems  Constitutional: Negative.   HENT:  Positive for congestion, ear pain (R ear fullness), postnasal drip, rhinorrhea and sinus pressure. Negative for sinus pain, sore throat and trouble swallowing.   Respiratory: Negative.    Cardiovascular: Negative.   Neurological: Negative.     Physical Exam Constitutional:      Appearance: Normal appearance.  HENT:     Head: Atraumatic.     Right Ear: Tympanic membrane, ear canal and external ear normal.     Left Ear: Tympanic membrane, ear canal and external ear normal.     Ears:     Comments: Right ear appears dull without erythema or swelling    Nose: Nose normal.     Mouth/Throat:     Mouth: Mucous membranes are moist.     Pharynx: Oropharynx is clear.  Eyes:     Extraocular Movements: Extraocular movements intact.  Cardiovascular:     Rate and Rhythm: Normal rate and regular rhythm.  Pulmonary:     Effort: Pulmonary effort is normal.     Breath sounds: Normal breath sounds.  Musculoskeletal:     Cervical back: Neck supple.  Skin:  General: Skin is warm.  Neurological:     Mental Status: She is alert.  Psychiatric:        Mood and Affect: Mood normal.        Behavior: Behavior normal.        Thought Content: Thought content normal.        Judgment: Judgment normal.       Assessment/Plan:  1. Ear congestion, right  Increase fluids Take Claritin or Allegra and take Zyrtec in the evening.  Take oral decongestant ie Sudafed for congestion Continue to watch for worsening symptoms Briefly discussed oral steroids if symptoms don't improve with the above plan. Pt verbalized understanding and in agreement.   General Counseling: verdia gelb understanding of the findings of todays visit and agrees with plan of treatment. I  have discussed any further diagnostic evaluation that may be needed or ordered today. We also reviewed her medications today. she has been encouraged to call the office with any questions or concerns that should arise related to todays visit.    Time spent:20 Minutes    Gilberto Better, [place] Physician Assistant

## 2023-03-22 ENCOUNTER — Other Ambulatory Visit: Payer: Self-pay | Admitting: Certified Nurse Midwife

## 2023-03-22 DIAGNOSIS — Z1231 Encounter for screening mammogram for malignant neoplasm of breast: Secondary | ICD-10-CM

## 2023-04-05 ENCOUNTER — Ambulatory Visit
Admission: RE | Admit: 2023-04-05 | Discharge: 2023-04-05 | Disposition: A | Discharge status: Home / Self Care | Payer: BC Managed Care – PPO | Source: Ambulatory Visit | Attending: Certified Nurse Midwife | Admitting: Certified Nurse Midwife

## 2023-04-05 DIAGNOSIS — Z1231 Encounter for screening mammogram for malignant neoplasm of breast: Secondary | ICD-10-CM | POA: Diagnosis present

## 2023-06-07 ENCOUNTER — Encounter: Payer: Self-pay | Admitting: Physician Assistant

## 2023-06-07 ENCOUNTER — Ambulatory Visit (INDEPENDENT_AMBULATORY_CARE_PROVIDER_SITE_OTHER): Payer: Self-pay | Admitting: Physician Assistant

## 2023-06-07 VITALS — BP 114/70 | HR 88 | Temp 98.8°F | Ht 61.0 in | Wt 154.0 lb

## 2023-06-07 DIAGNOSIS — Z20822 Contact with and (suspected) exposure to covid-19: Secondary | ICD-10-CM

## 2023-06-07 NOTE — Progress Notes (Signed)
[facility] [address] [place], [place] [address]   Office Visit Note  Patient Name: April Hoffman Date of Birth [id number]  Medical Record number [id number]  Date of Service: 06/07/2023  Chief Complaint  Patient presents with   Acute Visit    Patient reports being exposed to COVID yesterday through a coworker. She woke up feeling achy this morning and has a sore throat. Denies SOB/fever. She has not tested with an at-home COVID test.      42 y/o F presents to the clinic for c/o body aches, slight congestion since yesterday. She was exposed to a positive covid colleague. Currently denies fever, CP, SOB, or wheezing.       Current Medication:  Outpatient Encounter Medications as of 06/07/2023  Medication Sig Note   cetirizine (ZYRTEC) 10 MG tablet Take 10 mg by mouth daily.    estradiol (ESTRACE) 0.1 MG/GM vaginal cream Place 1 Applicatorful vaginally 3 (three) times a week.    etonogestrel (NEXPLANON) 68 MG IMPL implant Inject 1 each into the skin once as needed. 09/16/2020: Implanted 2 years ago   hydrOXYzine (ATARAX) 25 MG tablet Take by mouth as needed.    LAVENDER OIL PO Take 1 capsule by mouth daily.    OVER THE COUNTER MEDICATION Take by mouth daily. Reishi extract    [DISCONTINUED] albuterol (VENTOLIN HFA) 108 (90 Base) MCG/ACT inhaler TAKE 2 PUFFS BY MOUTH EVERY 6 HOURS AS NEEDED FOR WHEEZE OR SHORTNESS OF BREATH (Patient not taking: Reported on 02/10/2022)    [DISCONTINUED] Cholecalciferol 25 MCG (1000 UT) capsule Take by mouth.    [DISCONTINUED] cyanocobalamin (VITAMIN B12) 1000 MCG tablet Take by mouth.    [DISCONTINUED] cyclobenzaprine (FLEXERIL) 10 MG tablet Take 1 tablet (10 mg total) by mouth at bedtime.    [DISCONTINUED] estradiol (ESTRACE) 0.1 MG/GM vaginal cream SMARTSIG:0.25 Applicator Vaginal Twice a Week    [DISCONTINUED] hydrOXYzine (ATARAX/VISTARIL) 25 MG tablet Take 1 tablet (25 mg total) by mouth 3 (three) times daily.    [DISCONTINUED] VITAMIN  D, CHOLECALCIFEROL, PO Take 5,000 Units by mouth daily.    No facility-administered encounter medications on file as of 06/07/2023.      Medical History: History reviewed. No pertinent past medical history.   Vital Signs: BP 114/70 (BP Location: Left Arm, Patient Position: Sitting, Cuff Size: Normal)   Pulse 88   Temp 98.8 F (37.1 C)   Ht 5\' 1"  (1.549 m)   Wt 154 lb (69.9 kg)   SpO2 98%   BMI 29.10 kg/m    Review of Systems  Constitutional: Negative.   HENT:  Positive for congestion. Negative for postnasal drip, sinus pressure, sinus pain, sore throat and trouble swallowing.   Eyes: Negative.   Respiratory: Negative.    Cardiovascular: Negative.   Musculoskeletal:  Positive for myalgias.  Neurological: Negative.     Physical Exam Constitutional:      Appearance: Normal appearance.  HENT:     Head: Atraumatic.     Right Ear: Tympanic membrane, ear canal and external ear normal.     Left Ear: Tympanic membrane, ear canal and external ear normal.     Nose: Nose normal.     Mouth/Throat:     Mouth: Mucous membranes are moist.     Pharynx: Oropharynx is clear.  Eyes:     Extraocular Movements: Extraocular movements intact.  Cardiovascular:     Rate and Rhythm: Normal rate and regular rhythm.  Pulmonary:     Effort: Pulmonary effort  is normal.     Breath sounds: Normal breath sounds.  Musculoskeletal:     Cervical back: Neck supple.  Skin:    General: Skin is warm.  Neurological:     Mental Status: She is alert.  Psychiatric:        Mood and Affect: Mood normal.        Behavior: Behavior normal.        Thought Content: Thought content normal.        Judgment: Judgment normal.       Assessment/Plan:  1. Exposure to confirmed case of COVID-19  Reviewed my clinical findings with patient. She verbalized understanding.  Too early to test for covid.  Recommend to be covid tested at home in two days if symptoms worsen.  Patient will contact us with her  covid test result. If positive then take otc medicines as discussed including mucinex for cough, Ibuprofen for body aches, Tylenol for fever, etc. Briefly discussed Paxlovid and patient prefers a Rx if she is covid positive. In 2022 she had covid and was extremely sick for 2 months and doesn't wish to go through this again and thus requesting a Paxlovid Rx.   General Counseling: April Hoffman understanding of the findings of todays visit and agrees with plan of treatment. I have discussed any further diagnostic evaluation that may be needed or ordered today. We also reviewed her medications today. she has been encouraged to call the office with any questions or concerns that should arise related to todays visit.    Time spent:20 Minutes    Gilberto Better, [place] Physician Assistant

## 2023-06-10 ENCOUNTER — Ambulatory Visit (INDEPENDENT_AMBULATORY_CARE_PROVIDER_SITE_OTHER): Payer: Self-pay | Admitting: Adult Health

## 2023-06-10 ENCOUNTER — Other Ambulatory Visit: Payer: Self-pay

## 2023-06-10 ENCOUNTER — Encounter: Payer: Self-pay | Admitting: Adult Health

## 2023-06-10 VITALS — BP 104/62 | HR 79 | Temp 98.5°F

## 2023-06-10 DIAGNOSIS — Z20822 Contact with and (suspected) exposure to covid-19: Secondary | ICD-10-CM

## 2023-06-10 DIAGNOSIS — U071 COVID-19: Secondary | ICD-10-CM

## 2023-06-10 LAB — POC COVID19 BINAXNOW: SARS Coronavirus 2 Ag: POSITIVE — AB

## 2023-06-10 NOTE — Progress Notes (Signed)
[facility] [address] [place], [place] [address]   Office Visit Note  Patient Name: April Hoffman Date of Birth [id number]  Medical Record number [id number]  Date of Service: 06/10/2023  Chief Complaint  Patient presents with   Follow-up    Patient requesting to be tested for COVID. She said she has developed a cough, has some tightness in her chest, and her sore throat has worsened since being seen this past Tuesday.     HPI Pt is here for a sick visit. She reports she sat in a closed office with a friend who later tested positive for covid.  They both have similar symptoms.  She reports headache, sore throat, cough, chest tightness as well as fatigue.    Current Medication:  Outpatient Encounter Medications as of 06/10/2023  Medication Sig Note   cetirizine (ZYRTEC) 10 MG tablet Take 10 mg by mouth daily.    estradiol (ESTRACE) 0.1 MG/GM vaginal cream Place 1 Applicatorful vaginally 3 (three) times a week.    etonogestrel (NEXPLANON) 68 MG IMPL implant Inject 1 each into the skin once as needed. 09/16/2020: Implanted 2 years ago   hydrOXYzine (ATARAX) 25 MG tablet Take by mouth as needed.    LAVENDER OIL PO Take 1 capsule by mouth daily.    OVER THE COUNTER MEDICATION Take by mouth daily. Reishi extract    No facility-administered encounter medications on file as of 06/10/2023.      Medical History: History reviewed. No pertinent past medical history.   Vital Signs: BP 104/62   Pulse 79   Temp 98.5 F (36.9 C)   SpO2 98%    Review of Systems  Constitutional:  Positive for fatigue. Negative for fever.  HENT:  Positive for congestion and sore throat.   Eyes:  Negative for pain and redness.  Respiratory:  Positive for cough and chest tightness.     Physical Exam Vitals reviewed.  Constitutional:      Appearance: Normal appearance.  HENT:     Head: Normocephalic.  Cardiovascular:     Rate and Rhythm: Normal rate.  Pulmonary:     Effort:  Pulmonary effort is normal. No respiratory distress.     Breath sounds: No wheezing.  Neurological:     Mental Status: She is alert.    Results for orders placed or performed in visit on 06/10/23 (from the past 24 hour(s))  POC COVID-19     Status: Abnormal   Collection Time: 06/10/23 11:52 AM  Result Value Ref Range   SARS Coronavirus 2 Ag Positive (A) Negative    Assessment/Plan: 1. COVID-19 Positive  test.  Take over-the-counter medicines (such as Dayquil or Nyquil) as discussed at your visit to help manage your symptoms.  Send a MyChart message to the provider or schedule a return appointment as needed for new/worsening symptoms (especially shortness of breath or chest pain) or if symptoms not improving with recommended treatment over the next 5-7 days.      2. Close exposure to COVID-19 virus - POC COVID-19             General Counseling: Chairty verbalizes understanding of the findings of todays visit and agrees with plan of treatment. I have discussed any further diagnostic evaluation that may be needed or ordered today. We also reviewed her medications today. she has been encouraged to call the office with any questions or concerns that should arise related to todays visit.   Orders Placed This Encounter  Procedures  POC COVID-19    No orders of the defined types were placed in this encounter.   Time spent:10 Minutes    Johnna Acosta AGNP-C Nurse Practitioner

## 2023-12-27 ENCOUNTER — Other Ambulatory Visit: Payer: Self-pay | Admitting: Certified Nurse Midwife

## 2023-12-27 DIAGNOSIS — Z1231 Encounter for screening mammogram for malignant neoplasm of breast: Secondary | ICD-10-CM

## 2024-03-18 IMAGING — MG MM DIGITAL SCREENING BILAT W/ TOMO AND CAD
6 of 12 series · 6 of 36 positions shown · non-contrast
Comparison: None.

CLINICAL DATA: Screening.

EXAM:
DIGITAL SCREENING BILATERAL MAMMOGRAM WITH TOMOSYNTHESIS AND CAD
TECHNIQUE: Bilateral screening digital craniocaudal and mediolateral oblique
mammograms were obtained. Bilateral screening digital breast
tomosynthesis was performed. The images were evaluated with
computer-aided detection.

[R XCCL synth-2D]
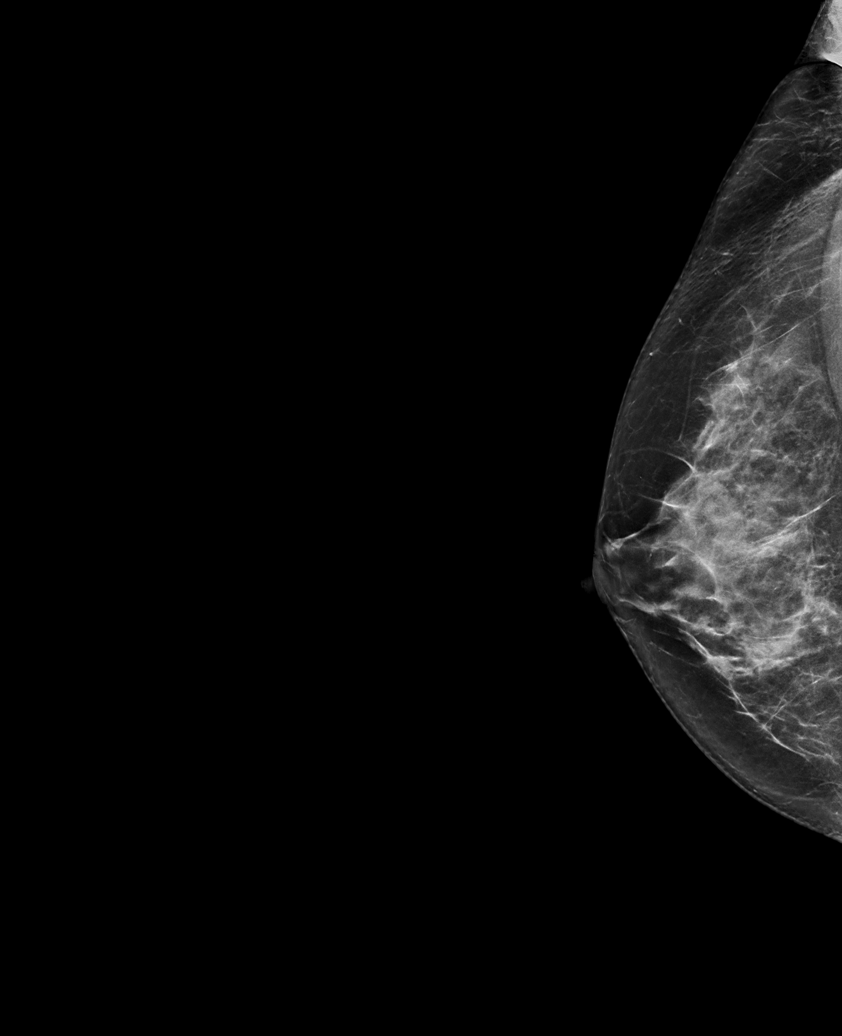

[R MLO synth-2D]
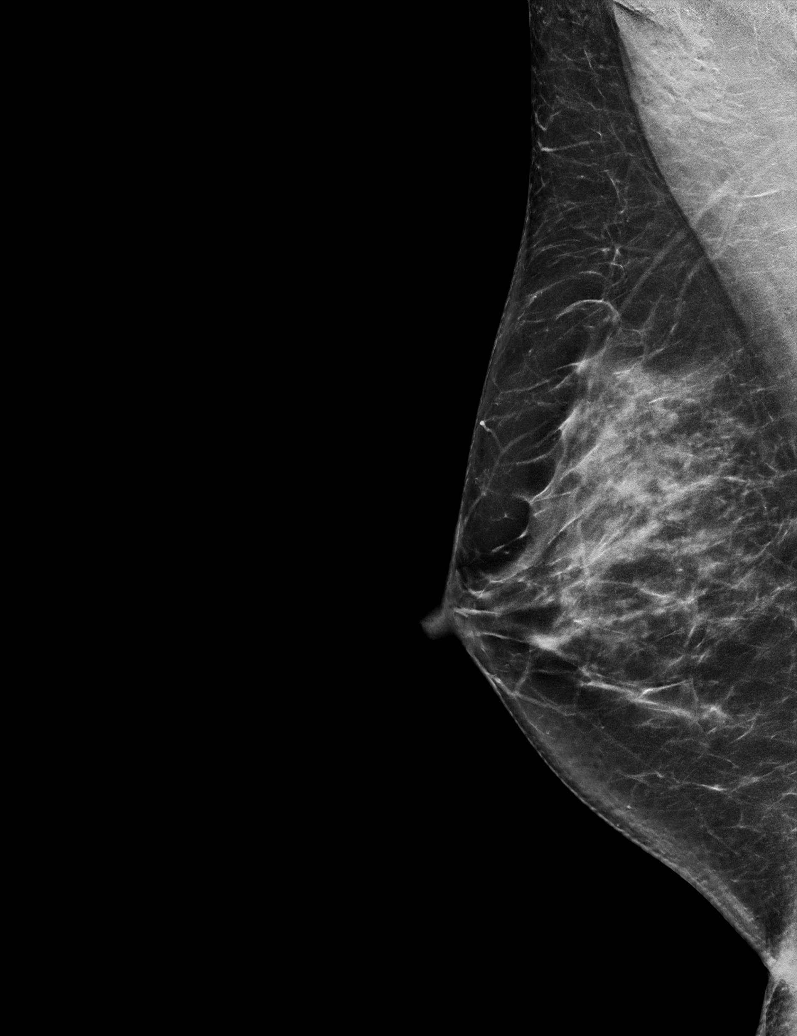

[L CC synth-2D]
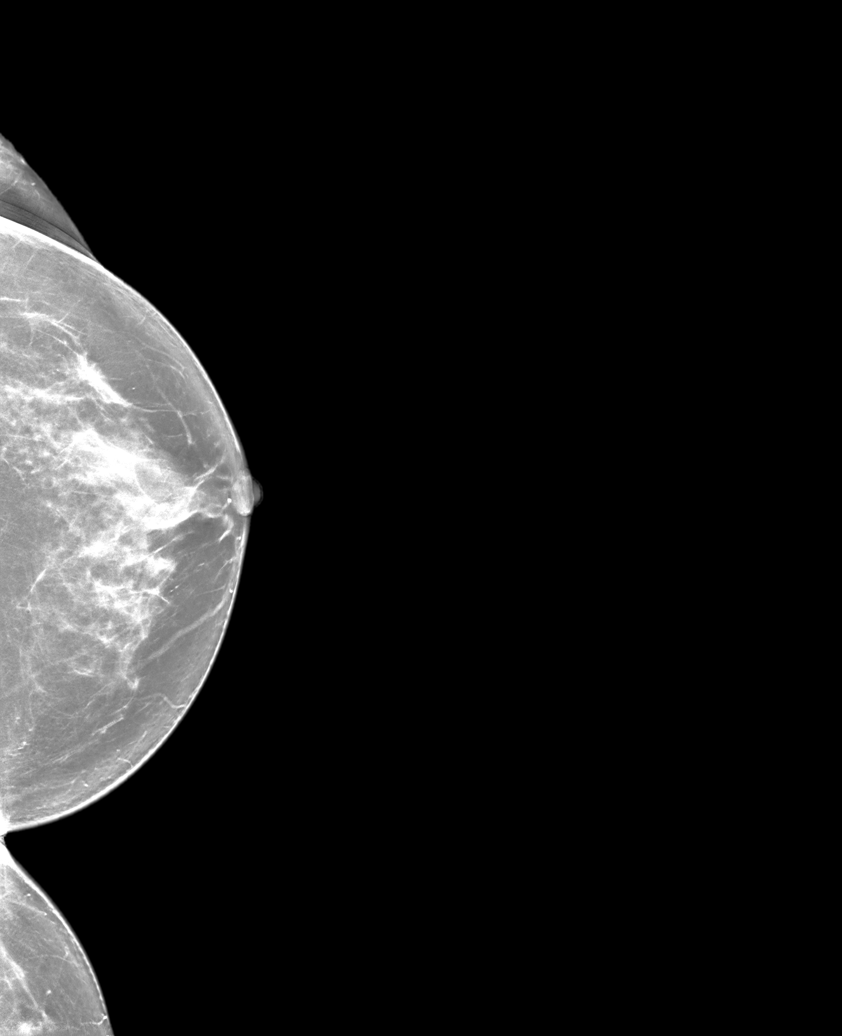

[R CC synth-2D]
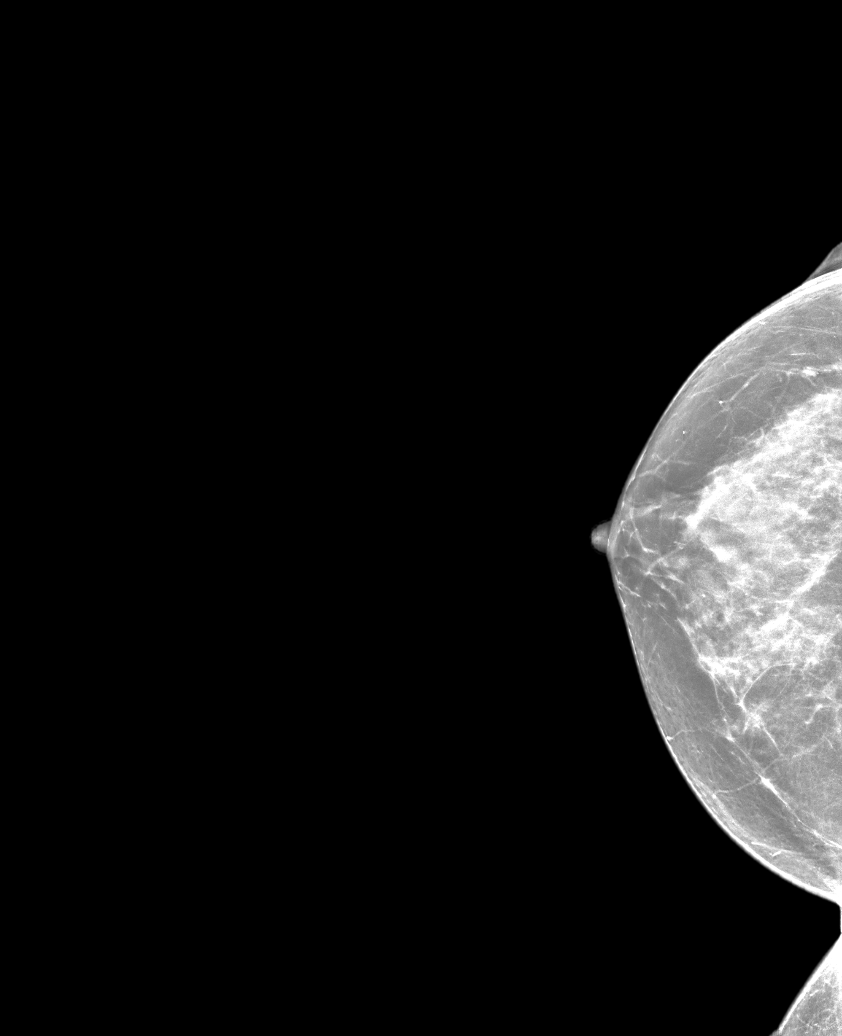

[L XCCL synth-2D]
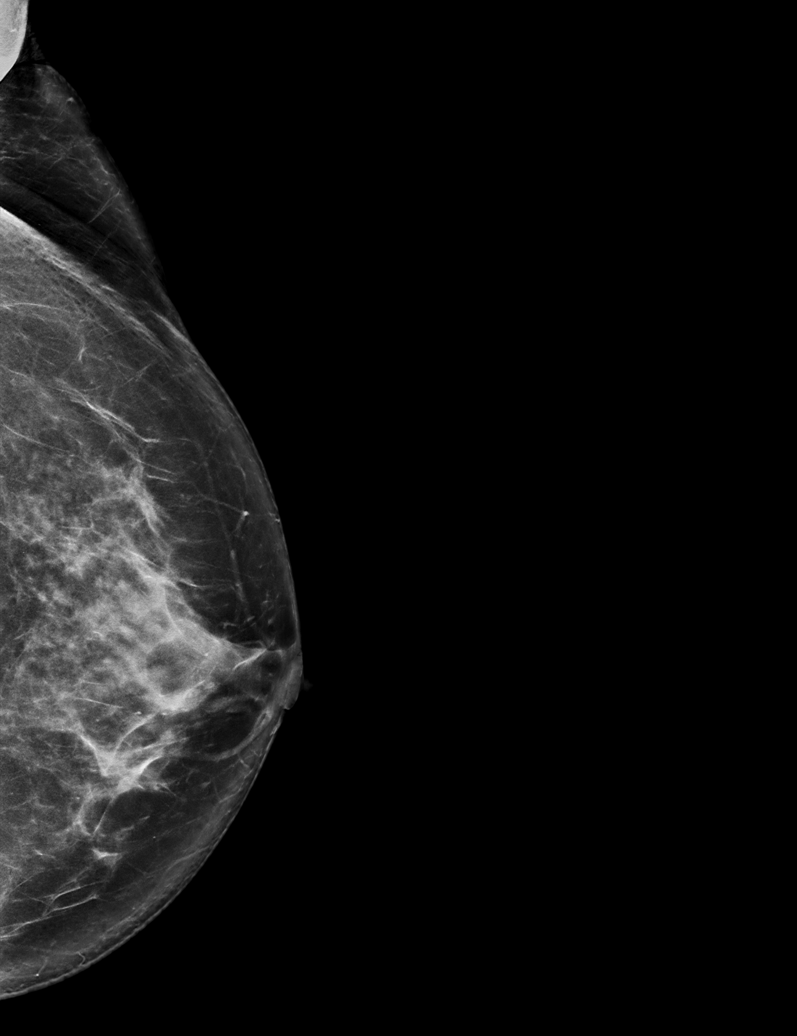

[L MLO synth-2D]
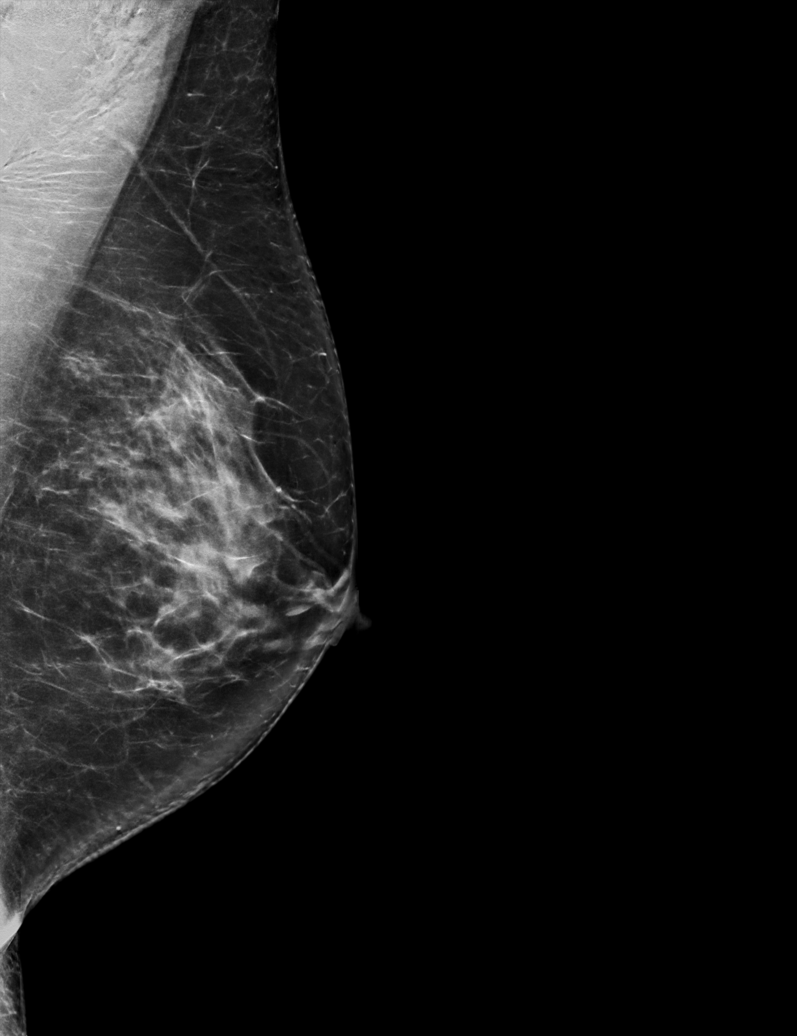

[6 of 36 positions shown; findings below may reference images not displayed]

ACR Breast Density Category c: The breast tissue is heterogeneously
dense, which may obscure small masses
FINDINGS: There are no findings suspicious for malignancy.
IMPRESSION: No mammographic evidence of malignancy. A result letter of this
screening mammogram will be mailed directly to the patient.

RECOMMENDATION:
Screening mammogram in one year. (Code:C8-T-HNK)

BI-RADS CATEGORY  1: Negative.

## 2024-04-05 ENCOUNTER — Ambulatory Visit
Admission: RE | Admit: 2024-04-05 | Discharge: 2024-04-05 | Disposition: A | Discharge status: Home / Self Care | Source: Ambulatory Visit | Attending: Certified Nurse Midwife | Admitting: Certified Nurse Midwife

## 2024-04-05 DIAGNOSIS — Z1231 Encounter for screening mammogram for malignant neoplasm of breast: Secondary | ICD-10-CM | POA: Diagnosis present

## 2024-04-25 ENCOUNTER — Ambulatory Visit: Attending: Certified Nurse Midwife | Admitting: Physical Therapy

## 2024-04-25 ENCOUNTER — Encounter: Payer: Self-pay | Admitting: Physical Therapy

## 2024-04-25 DIAGNOSIS — M533 Sacrococcygeal disorders, not elsewhere classified: Secondary | ICD-10-CM | POA: Diagnosis present

## 2024-04-25 DIAGNOSIS — M5459 Other low back pain: Secondary | ICD-10-CM | POA: Diagnosis present

## 2024-04-25 DIAGNOSIS — R2689 Other abnormalities of gait and mobility: Secondary | ICD-10-CM | POA: Diagnosis present

## 2024-04-25 NOTE — Therapy (Signed)
 OUTPATIENT PHYSICAL THERAPY EVALUATION   Patient Name: April Hoffman MRN: [id number] DOB:Dec 09, 1980, 43 y.o., female Today's Date: 04/25/2024   PT End of Session - 04/25/24 1636     Visit Number 1    Number of Visits 10    Date for PT Re-Evaluation 07/04/24    PT Start Time 1637    PT Stop Time 1630    PT Time Calculation (min) 1433 min    Activity Tolerance Patient tolerated treatment well;No increased pain    Behavior During Therapy [facility] for tasks assessed/performed          History reviewed. No pertinent past medical history. Past Surgical History:  Procedure Laterality Date   GUM SURGERY     WISDOM TOOTH EXTRACTION     Patient Active Problem List   Diagnosis Date Noted   B12 deficiency 05/25/2021   GAD (generalized anxiety disorder) 05/25/2021   Major depressive disorder, recurrent episode, moderate with mixed features (HCC) 05/25/2021   Panic attacks 05/25/2021   Gastroesophageal reflux disease without esophagitis 02/03/2021   Chest pain at rest 02/03/2021   Dysphagia 02/03/2021   Anxiety 02/03/2021   Current mild episode of major depressive disorder without prior episode (HCC) 08/22/2019    PCP: Efraim   REFERRING PROVIDER: Tanda MART DIAG:  M62.89 (ICD-10-CM) - Pelvic floor tension  N94.10 (ICD-10-CM) - Dyspareunia in female    Rationale for Evaluation and Treatment   THERehabilitationRAPY DIAG:  Other low back pain  Sacrococcygeal disorders, not elsewhere classified  Other abnormalities of gait and mobility  ONSET DATE:   SUBJECTIVE:                                                                                                                                                                                           SUBJECTIVE STATEMENT:  1 ) B LBP  : at its worst 7/10 after sitting 2 hours, Pain now only is located across the back and hips,  When it radiates to the top upper posterior thigh occurs when she has been sitting    Teaches yoga 1 class a week, 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  Pt read an article that indicated  Power peeing with using her abdominal muscles to pee and the need to stop doing that. Pt realized she had been doing that for a long time.  Pt reports she finds it difficult to relax her pelvic floor to pee.       3)  SUI :  with sneezing . New in the last  2 years.  Pt does not  wear pads.   4)  incomplete emptying urine : pt has to return to pee again after the first time. This occurs 50% of the time    5)  Loose stools,   type 4 occurring once a week,  Type 6-7 occurs every time,  BMs occur 3 x a day . Pt has struggled with BMs her whole life.  Pt used to go between constipation and diarrhea a lot.    Daily fluid : water not enough 42 fl oz .  32 fl oz coffee, no juice, sodas ,   6) pain with sexual intercourse  -Did pelvic floor therapy 10 years ago with 3 months of session, therapist mostly focused on manual Tx internally and use of dilators. Pt did not enjoy using the dilators.    PERTINENT HISTORY:  R sciatic leg pain and loss of sensation lasted Feb-April 2024 , Chiropractic T x helped to regain sensation. Pain now only is located across the back and hips,  When it radiates to the top upper posterior thigh occurs when she has been sitting for 9 hrs/ day at a conference.    PAIN:  Are you having pain? Yes: see above   PRECAUTIONS: None  WEIGHT BEARING RESTRICTIONS: No  FALLS:  Has patient fallen in last 6 months?  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   PLOF: pain with sexual intercourse worsened as she got older    PATIENT GOALS:     OBJECTIVE:    [facility] PT Assessment - 04/25/24 1655       AROM   Overall AROM Comments L sideflexion /  Rotation causing pinching at L PSIS  localized pain  4/10 rotation, 3/10 pain with sideflexion  ,  L c 50cm  digit III to the floor at the onset of pain,   R 44 cm      Palpation   Spinal mobility R shoulder/ R iliac crest lowered,   hypomobility at C/T junction with small dowagers       Ambulation/Gait   Gait Comments 1.44 m/s, decreased stance on R, R trunk lean,  limited thoracic rotation posterior,  R anterior pelvic rotation          [facility] Adult PT Treatment/Exercise - 04/25/24 1655       Self-Care   Self-Care Other Self-Care Comments    Other Self-Care Comments  d active listening to past Hx with LBP, discussed holding off with chriporactic Tx with Pelvic PT  and pt voiced agreement, Explained  POC does not require use of dilators for pain with intercourse and that POC will also to help improve the other function of pelvic floor related to urinary and bowel and core.  Provided food chart to track food and stool consistency type to further help pt improve bowel and GI function, plan to address uneven spine and pelvic alignment to minimize loose stools and decrease frequency urination/ pelvic floor tightness .  Discussed modifcation to yoga practice and teacher to minimize Sx  at future sessions. Provided education on ergonomic workstation modifications today and to minimize downward straining with head lift in Pilate classes.      Therapeutic Activites    Other Therapeutic Activities cued for proper sitting posture, explained modifications to workstationto minimize  forward head posture,  explained modification to pilates to minimize downward force onto pelvic floor, educated pt to complete food chart to improve bowel /GI function,  encouraged to continue water intake      Neuro Re-ed    Neuro Re-ed Details  --               HOME EXERCISE PROGRAM: See pt instruction section    ASSESSMENT:  CLINICAL IMPRESSION: Pt is a 43  yo  who presents with  following issues which impact  QOL, ADL,  fitness, social and community activities:    1 ) B LBP  2) Frequent urination 3)  SUI  4)  incomplete emptying urine 5)  Loose stools 6) pain with sexual intercourse  Pt's musculoskeletal assessment revealed uneven pelvic girdle and shoulder height, asymmetries to gait pattern, limited spinal /pelvic mobility, dyscoordination and strength of pelvic floor mm, hip weakness, poor body mechanics which places strain on the abdominal/pelvic floor mm. These are deficits that indicate an ineffective intraabdominal pressure system associated with increased risk for pt's Sx.     Pt will benefit from propioception/ coordination/ body mechanics training and education with gravity-loaded tasks at work and home and  fitness modifications in order to gain a more effective intraabdominal pressure system to minimize Sx. Advised pt to not perform sit-ups and crunches in pilate classes as these movement patterns lead to more downward forces on the pelvic floor, negatively impacting abdominopelvic/spinal dysfunctions.   Pt was provided education on etiology of Sx with anatomy, physiology explanation with images along with the benefits of customized pelvic PT Tx based on pt's medical conditions and musculoskeletal deficits.  Explained the physiology of deep core mm coordination and roles of pelvic floor function in urination, defecation, sexual function, and postural control with deep core mm system, and the role of posture and alignment to help pelvic issues.   Regional interdependent approaches will yield greater benefits in pt's POC due to the complexity of pt's medical Hx and the significant impact their Sx have had on their QOL. Pt would benefit from a biopsychosocial approach to yield optimal outcomes. Plan to build interdisciplinary team with pt's providers to optimize patient-centered care as needed.   Following Tx today which pt tolerated without complaints,  pt demo'd proper body mechanics to  minimize straining pelvic floor. Provided active listening to past Hx with LBP, discussed holding off with chriporactic Tx with Pelvic PT  and pt voiced agreement, Explained  POC does not require use of dilators for pain with intercourse and that POC will also to help improve the other function of pelvic floor related to urinary and bowel and core.  Provided food chart to track food and stool consistency type to further help pt improve bowel and GI function, plan to address uneven spine and pelvic alignment to minimize loose stools and decrease frequency urination/ pelvic floor tightness .  Discussed modifcation to yoga practice and teacher to minimize Sx  at future sessions. Provided education on ergonomic workstation modifications today and to minimize downward straining with head lift in Pilate classes.   Plan to address realignment of spine/ pelvis at next session to help promote optimize IAP system for improved pelvic floor function, trunk stability, gait, balance, stabilization with mobility tasks.  Plan to address pelvic floor issues once pelvis and spine are realigned to yield better outcomes.  Pt benefits from skilled PT.    OBJECTIVE IMPAIRMENTS decreased activity tolerance, decreased coordination, decreased endurance, decreased mobility, difficulty walking, decreased ROM, decreased strength, decreased safety awareness, hypomobility, increased muscle spasms, impaired flexibility, improper body mechanics, postural dysfunction, and pain.   ACTIVITY LIMITATIONS  self-care,  driving and sitting    PARTICIPATION LIMITATIONS:  community  activities    PERSONAL FACTORS   affecting patient's functional outcome:    REHAB POTENTIAL: Good   CLINICAL DECISION MAKING: Evolving/moderate complexity   EVALUATION COMPLEXITY: Moderate    PATIENT EDUCATION:    Education details: Showed pt anatomy images. Explained muscles attachments/ connection,  physiology of deep core system/ spinal- thoracic-pelvis-lower kinetic chain as they relate to pt's presentation, Sx, and past Hx. Explained what and how these areas of deficits need to be restored to balance and function    See Therapeutic activity / neuromuscular re-education section  Answered pt's questions.   Person educated: Patient Education method: Explanation, Demonstration, Tactile cues, Verbal cues, and Handouts Education comprehension: verbalized understanding, returned demonstration, verbal cues required, tactile cues required, and needs further education     PLAN: PT FREQUENCY: 1x/week   PT DURATION: 10 weeks   PLANNED INTERVENTIONS:   Gait training;Stair training;Functional mobility training;DME Instruction;Therapeutic activities;Therapeutic exercise;Balance training;Neuromuscular re-education;Patient/family education;Vestibular;Visual/perceptual remediation/compensation;Passive range of motion;Moist Heat;Cryotherapy;Traction;Canalith Repostioning;Joint Manipulations;Manual lymph drainage;Manual techniques;Scar mobilization;Energy conservation;Dry needling;ADLs/Self Care Home Management;Biofeedback;Electrical Stimulation;Taping    PLAN FOR NEXT SESSION: See clinical impression for plan     GOALS: Goals reviewed with patient? Yes  SHORT TERM GOALS: Target date: 05/23/2024    Pt will demo IND with HEP                    Baseline: Not IND            Goal status: INITIAL   LONG TERM GOALS: Target date: 07/04/2024    1.Pt will demo proper deep core coordination without chest breathing and optimal excursion of diaphragm/pelvic floor in order to promote spinal stability and pelvic floor function to minimize SUI and dyspareunia  Baseline: dyscoordination Goal status: INITIAL  2.  Pt will demo proper body mechanics in against gravity tasks and ADLs  work tasks, fitness ( modified yoga and pilate practice)   to minimize straining pelvic floor / back    Baseline: not IND,  improper form that places strain on pelvic floor  Goal status: INITIAL   3. Pt will demo increased gait speed > 1.6  m/s with reciprocal gait pattern, longer stride length  in order to ambulate safely in community and return to fitness routine  Baseline: 1.44 m/s, decreased stance on R, R trunk lean,  limited thoracic rotation posterior,  R anterior pelvic rotation  Goal status: INITIAL   4. Pt will demo levelled pelvic girdle and shoulder height in order to progress to deep core strengthening HEP and restore mobility at spine, pelvis, gait, posture minimize falls, and improve balance  Baseline: R shoulder/ R iliac crest lowered,   hypomobility at C/T junction with small dowagers  Goal status: INITIAL   5. Pt will report no LBP with rotation/ sideflexion and regain sideflexion bilaterally in order to perform ADLs  Baseline: L sideflexion / Rotation causing pinching at L PSIS  localized pain  4/10 rotation, 3/10 pain with sideflexion  ,  L c 50cm  digit III to the floor at the onset of pain,   R 44 cm  Goal status: INITIAL  6. Pt will report no longer needing to  return to pee a second time and able to empty urine completely 100% of the time to minimize risks for UTI and return to work tasks without interruptions Baseline:pt has to return to pee again after the first time. This occurs 50% of the time   Goal status: INITIAL   7. 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 Pt reports type 4 consistency > 50% of the time per day instead of once a week,  Type 6-7 across 50% of the time  Baseline:ype 4 occurring once a week,  Type 6-7 occurs every time,  BMs occur 3 x a day . Pt has struggled with BMs her whole life.  Pt used to go between constipation and diarrhea a lot.        Pia Lupe Plump, PT 04/25/2024, 5:01 PM

## 2024-04-25 NOTE — Patient Instructions (Signed)
 Ergonomic sheet -use wireless keyboard __  [place] in pilates  __  Maintain water   ___   Sitting posture on sitting bones   __   complete food chart

## 2024-05-15 ENCOUNTER — Ambulatory Visit: Admitting: Physical Therapy

## 2024-05-15 DIAGNOSIS — R2689 Other abnormalities of gait and mobility: Secondary | ICD-10-CM

## 2024-05-15 DIAGNOSIS — M5459 Other low back pain: Secondary | ICD-10-CM

## 2024-05-15 DIAGNOSIS — M533 Sacrococcygeal disorders, not elsewhere classified: Secondary | ICD-10-CM

## 2024-05-15 NOTE — Patient Instructions (Addendum)
  Lengthen Back rib by L  shoulder ( winging)    Lie on R  side , pillow between knees and under head  Pull  L arm overhead over mattress, grab the edge of mattress,pull it upward, drawing elbow away from ears  Breathing 10 reps  Brushing arm with 3/4 turn onto pillow behind back  Lying on R  side ,Pillow/ Block between knees     dragging top forearm across ribs below breast rotating 3/4 turn,  rotating  _L_ only this week ,  relax onto the pillow behind the back  and then back to other palm , maintain top palm on body whole top and not lift shoulder  Do this side this week       Wait do both sides until we have levelled out your spine and shoulders ___   Breathing:   Not from belly   Notice points of contact through back body, Inhale quiet  360 deg expansion at the rib, exhale  quiet   _____   Avoid straining pelvic floor, abdominal muscles , spine  Use log rolling technique instead of getting out of bed with your neck or the sit-up     Log rolling into and out of bed   Log rolling into and out of bed If getting out of bed on R side, Bent knees, scoot hips/ shoulder to L  Raise R arm completely overhead, rolling onto armpit  Then lower bent knees to bed to get into complete side lying position  Then drop legs off bed, and push up onto R elbow/forearm, and use L hand to push onto the bed    Dig elbows and feet to lift hte buttocks and scoot without lifting head ]   ___  Standing with equal weight across ballmounds and heels , not just the heels

## 2024-05-15 NOTE — Therapy (Addendum)
 OUTPATIENT PHYSICAL THERAPY TREATMENT    Patient Name: April Hoffman MRN: [id number] DOB:[birth year], 43 y.o., female Today's Date: 05/15/2024   PT End of Session - 05/15/24 1022     Visit Number 2    Number of Visits 10    Date for PT Re-Evaluation 07/04/24    PT Start Time 1017    PT Stop Time 1100    PT Time Calculation (min) 43 min    Activity Tolerance Patient tolerated treatment well;No increased pain    Behavior During Therapy [facility] for tasks assessed/performed          No past medical history on file. Past Surgical History:  Procedure Laterality Date   GUM SURGERY     WISDOM TOOTH EXTRACTION     Patient Active Problem List   Diagnosis Date Noted   B12 deficiency 05/25/2021   GAD (generalized anxiety disorder) 05/25/2021   Major depressive disorder, recurrent episode, moderate with mixed features (HCC) 05/25/2021   Panic attacks 05/25/2021   Gastroesophageal reflux disease without esophagitis 02/03/2021   Chest pain at rest 02/03/2021   Dysphagia 02/03/2021   Anxiety 02/03/2021   Current mild episode of major depressive disorder without prior episode (HCC) 08/22/2019    PCP: Efraim   REFERRING PROVIDER: Tanda MART DIAG:  M62.89 (ICD-10-CM) - Pelvic floor tension  N94.10 (ICD-10-CM) - Dyspareunia in female    Rationale for Evaluation and Treatment   THERehabilitationRAPY DIAG:  Other low back pain  Sacrococcygeal disorders, not elsewhere classified  Other abnormalities of gait and mobility  ONSET DATE:   SUBJECTIVE:           SUBJECTIVE STATEMENT TODAY:                           Pt reports her LBP pain occurring over  6 day conference of sitting for 10 hours each day with breaks every hour. Pt did not get to do her yoga practice and stretching and whole body movement.  Pt carries a bookbag and a shoulder bag.[name]     LBP / gluts  felt locked up and occurs constantly 3-4/10 ,   The radiating pain to the bottom of L glut occurred for 3 days.   5/10 pain which it had been with original paain with flare ups 8-9/10   Today, she feels  sciatic pain to L glut  5/10.                                                                                                                                                  SUBJECTIVE STATEMENT ON EVAL 04/25/24 :  1 ) B LBP  : at its worst 7/10 after sitting 2 hours, Pain now only is located across the back and hips,  When it radiates to the top upper  posterior thigh occurs when she has been sitting   Teaches yoga 1 class a week, 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  Pt read an article that indicated  Power peeing with using her abdominal muscles to pee and the need to stop doing that. Pt realized she had been doing that for a long time.  Pt reports she finds it difficult to relax her pelvic floor to pee.       3)  SUI :  with sneezing . New in the last 2 years.  Pt does not  wear pads.   4)  incomplete emptying urine : pt has to return to pee again after the first time. This occurs 50% of the time    5)  Loose stools,   type 4 occurring once a week,  Type 6-7 occurs every time,  BMs occur 3 x a day . Pt has struggled with BMs her whole life.  Pt used to go between constipation and diarrhea a lot.    Daily fluid : water not enough 42 fl oz .  32 fl oz coffee, no juice, sodas ,   6) pain with sexual intercourse  -Did pelvic floor therapy 10 years ago with 3 months of session, therapist mostly focused on manual Tx internally and use of dilators. Pt did not enjoy using the dilators.    PERTINENT HISTORY:  R sciatic leg pain and loss of sensation lasted Feb-April 2024 , Chiropractic T x helped to regain sensation. Pain now only is located across the back and hips,  When it radiates to the top upper posterior thigh occurs when she has been sitting for 9 hrs/ day at a conference.    PAIN:  Are you having pain? Yes: see above   PRECAUTIONS:  None  WEIGHT BEARING RESTRICTIONS: No  FALLS:  Has patient fallen in last 6 months?  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   PLOF: pain with sexual intercourse worsened as she got older    PATIENT GOALS:     OBJECTIVE:    [facility] PT Assessment - 05/15/24 1039       Observation/Other Assessments   Observations leg length ASIS to medial malleoli: 78 cm      AROM   Overall AROM Comments L LBP to glut and SIJ with B sidelfexion and L rotation  ( Post Tx:  diminished pain on the R sidebend, no change to L sidebend and L rotation)      Palpation   Spinal mobility standing  : R iliac/ R shoulder lowered  ,  supine: R iliac/ ASIS, R low rib, lowered    SI assessment  hypomobile thoracic segments  T3-10, tightnes at teress minor/ intercostals L ,  ( Post Tx:  levelled rib, ASIS, medial malleoli)    Palpation comment upper trap overuse, limited posterior/ lateral  excursion of diaphragm,          [facility] Adult PT Treatment/Exercise - 05/15/24 1513       Self-Care   Other Self-Care Comments  discussed withholding from chiropractic Tx for 4 weeks instad of combining PT with chiro in same week to eliminate variables and to allow for therapist to troubleshoot pt's  misalignment of spine and pelvis with customized exercises to maintain corrections made with manual Tx. Pt agreed to withhold chriopractice Tx for 4 weeks . Explained focusing on one-sided stretches in HEP to minimize asymmetries of spine/ pelvis.      Therapeutic Activites    Other Therapeutic Activities cued for relaxation, optimal diaphragmatic excursion posterior, minimize belly breahting with ab pushing ,      Neuro Re-ed    Neuro Re-ed Details  cued for standing posture with decreased locked knees, more co-activated deep core  and cued for less upper  trap overuse with Brushing  exercise, provided cues for relaxation. grounding, and strategies with biopsychosocial approaches to help pt manage pain and recover from traumatic experience when she had the sciatic Sx which caused her to not be able to work and caused limitations to her functional mobility ,   Tactile cues for diaphragmatic breathing     Manual Therapy   Manual therapy comments STM/MWM at problem areas noted in assessment to promote levelled shoudlers and promote diaphragmatic excursion and relaxation for optimal deep core coordination/ relaxation of pelvic floor to minimize pelvic related issues                 HOME EXERCISE PROGRAM: See pt instruction section    ASSESSMENT:  CLINICAL IMPRESSION:                 Addressed misalignment of spine/ pelvis with assessment and manual Tx to T/ L junction.  Ruled out leg length difference. Pt's L shoulder, low rib, iliac crest, medial malleoli were higher than R in both standing and supine. Post Tx, shoulders and pelvis were levelled.  Slightly decreased pain with R sideflexion but no change to LBP with L rotation and sideflexcion. Plan to address these deficits more at upcoming sessions.  Manual Tx was modified to accommodate comfort and not increase tenderness and pain. Pt showed levelled shoulders and pelvis and improved thoracic mobility in rotation and lateral/ posterior excursion of diaphragm. Provided  cues for relaxation. grounding, and strategies with biopsychosocial approaches to help pt manage pain and recover from traumatic experience when she had the sciatic Sx which caused her to not be able to work and caused limitations to her functional mobility. Pt will benefit more biopsychosocial approaches in her POC to be empowered with self-management of pain. Plan to add neuroscience of pain education at upcoming sessions. .  Discussed withholding from chiropractic Tx for 4 weeks instead of combining PT with chiropractic  in same week to eliminate variables and to allow for therapist to troubleshoot pt's misalignment of spine and pelvis with customized exercises to maintain corrections made with manual Tx. Pt agreed to withhold chriopractice Tx for 4 weeks . Explained focusing on one-sided stretches in HEP to minimize asymmetries of spine/ pelvis.   Pt demo'd less upper trap overuse, less hyperextended knees in standing post Tx. Pt demo'd improved diaphragmatic function post Tx which will help with postural stability and pelvic function. Required Tactile cues for diaphragmatic breathing in hooklying position. Plan to advance to deep core training at upcoming sessions.                  These improvements made today will help promote optimize IAP system for improved pelvic floor function, trunk stability, gait, balance, stabilization with mobility tasks.                 Plan to address pelvic floor issues once pelvis and spine are  realigned to yield better outcomes.  Regional interdependent approaches will yield greater benefits in pt's POC.                                                      Pt benefits from skilled PT.    OBJECTIVE IMPAIRMENTS decreased activity tolerance, decreased coordination, decreased endurance, decreased mobility, difficulty walking, decreased ROM, decreased strength, decreased safety awareness, hypomobility, increased muscle spasms, impaired flexibility, improper body mechanics, postural dysfunction, and pain.   ACTIVITY LIMITATIONS  self-care,  driving and sitting    PARTICIPATION LIMITATIONS:  community  activities    PERSONAL FACTORS   affecting patient's functional outcome:    REHAB POTENTIAL: Good   CLINICAL DECISION MAKING: Evolving/moderate complexity   EVALUATION COMPLEXITY: Moderate    PATIENT EDUCATION:    Education details: Showed pt anatomy images. Explained muscles attachments/ connection, physiology of deep core system/ spinal- thoracic-pelvis-lower kinetic chain as  they relate to pt's presentation, Sx, and past Hx. Explained what and how these areas of deficits need to be restored to balance and function    See Therapeutic activity / neuromuscular re-education section  Answered pt's questions.   Person educated: Patient Education method: Explanation, Demonstration, Tactile cues, Verbal cues, and Handouts Education comprehension: verbalized understanding, returned demonstration, verbal cues required, tactile cues required, and needs further education     PLAN: PT FREQUENCY: 1x/week   PT DURATION: 10 weeks   PLANNED INTERVENTIONS:   Gait training;Stair training;Functional mobility training;DME Instruction;Therapeutic activities;Therapeutic exercise;Balance training;Neuromuscular re-education;Patient/family education;Vestibular;Visual/perceptual remediation/compensation;Passive range of motion;Moist Heat;Cryotherapy;Traction;Canalith Repostioning;Joint Manipulations;Manual lymph drainage;Manual techniques;Scar mobilization;Energy conservation;Dry needling;ADLs/Self Care Home Management;Biofeedback;Electrical Stimulation;Taping    PLAN FOR NEXT SESSION: See clinical impression for plan     GOALS: Goals reviewed with patient? Yes  SHORT TERM GOALS: Target date: 05/23/2024    Pt will demo IND with HEP                    Baseline: Not IND            Goal status: INITIAL   LONG TERM GOALS: Target date: 07/04/2024    1.Pt will demo proper deep core coordination without chest breathing and optimal excursion of diaphragm/pelvic floor in order to promote spinal stability and pelvic floor function to minimize SUI and dyspareunia  Baseline: dyscoordination Goal status: INITIAL  2.  Pt will demo proper body mechanics in against gravity tasks and ADLs  work tasks, fitness ( modified yoga and pilate practice)   to minimize straining pelvic floor / back    Baseline: not IND, improper form that places strain on pelvic floor  Goal status:  INITIAL   3. Pt will demo increased gait speed > 1.6  m/s with reciprocal gait pattern, longer stride length  in order to ambulate safely in community and return to fitness routine  Baseline: 1.44 m/s, decreased stance on R, R trunk lean,  limited thoracic rotation posterior,  R anterior pelvic rotation  Goal status: INITIAL   4. Pt will demo levelled pelvic girdle and shoulder height in order to progress to deep core strengthening HEP and restore mobility at spine, pelvis, gait, posture minimize falls, and improve balance  Baseline: R shoulder/ R iliac crest lowered,   hypomobility at C/T junction with small dowagers  Goal status: INITIAL   5. Pt will report no  LBP with rotation/ sideflexion and regain sideflexion bilaterally in order to perform ADLs  Baseline: L sideflexion / Rotation causing pinching at L PSIS  localized pain  4/10 rotation, 3/10 pain with sideflexion  ,  L c 50cm  digit III to the floor at the onset of pain,   R 44 cm  Goal status: INITIAL  6. Pt will report no longer needing to return to pee a second time and able to empty urine completely 100% of the time to minimize risks for UTI and return to work tasks without interruptions Baseline:pt has to return to pee again after the first time. This occurs 50% of the time   Goal status: INITIAL   7. 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 Pt reports type 4 consistency > 50% of the time per day instead of once a week,  Type 6-7 across 50% of the time  Baseline:ype 4 occurring once a week,  Type 6-7 occurs every time,  BMs occur 3 x a day . Pt has struggled with BMs her whole life.  Pt used to go between constipation and diarrhea a lot.        Pia Lupe Plump, PT 05/15/2024, 10:22 AM

## 2024-05-22 ENCOUNTER — Ambulatory Visit: Admitting: Physical Therapy

## 2024-05-22 DIAGNOSIS — M5459 Other low back pain: Secondary | ICD-10-CM | POA: Diagnosis not present

## 2024-05-22 DIAGNOSIS — M533 Sacrococcygeal disorders, not elsewhere classified: Secondary | ICD-10-CM

## 2024-05-22 DIAGNOSIS — R2689 Other abnormalities of gait and mobility: Secondary | ICD-10-CM

## 2024-05-22 NOTE — Patient Instructions (Signed)
 POINTS OF CONTACT THROUGH TRANSITION POSITIONS AND PLACES AND ACTIVITIES  To manage stress during chaotic transitions with move and upcoming semster strarting     Stretches :   Neck / shoulder stretches:    Lying on back - small sushi roll towel under neck  _ 6 directions   Chin up, down Rotation like changing lanes when driving Ear to shoulder like puppy dog   10 reps    _angel wings, lower elbows down , keep arms touching bed  10 reps     Motion is lotion  - good morning and evening     On your side - winging and brushing   Pillow between knees and behind back     On your back with towel roll under the neck  - neck 6 directions  - angel wings - ( dragging arms)     On your other side - winging and brushing   Pillow between knees and behind back

## 2024-05-22 NOTE — Therapy (Signed)
 OUTPATIENT PHYSICAL THERAPY TREATMENT    Patient Name: April Hoffman MRN: [id number] DOB:16-Apr-1981, 43 y.o., female Today's Date: 05/22/2024   PT End of Session - 05/22/24 1026     Visit Number 3    Number of Visits 10    Date for PT Re-Evaluation 07/04/24    PT Start Time 1023    PT Stop Time 1105    PT Time Calculation (min) 42 min    Activity Tolerance Patient tolerated treatment well;No increased pain    Behavior During Therapy [facility] for tasks assessed/performed          No past medical history on file. Past Surgical History:  Procedure Laterality Date   GUM SURGERY     WISDOM TOOTH EXTRACTION     Patient Active Problem List   Diagnosis Date Noted   B12 deficiency 05/25/2021   GAD (generalized anxiety disorder) 05/25/2021   Major depressive disorder, recurrent episode, moderate with mixed features (HCC) 05/25/2021   Panic attacks 05/25/2021   Gastroesophageal reflux disease without esophagitis 02/03/2021   Chest pain at rest 02/03/2021   Dysphagia 02/03/2021   Anxiety 02/03/2021   Current mild episode of major depressive disorder without prior episode (HCC) 08/22/2019    PCP: Efraim   REFERRING PROVIDER: Tanda MART DIAG:  M62.89 (ICD-10-CM) - Pelvic floor tension  N94.10 (ICD-10-CM) - Dyspareunia in female    Rationale for Evaluation and Treatment   THERehabilitationRAPY DIAG:  Other low back pain  Sacrococcygeal disorders, not elsewhere classified  Other abnormalities of gait and mobility  ONSET DATE:   SUBJECTIVE:           SUBJECTIVE STATEMENT TODAY:                           Pt reports 4 days after her LBP pain returned close to the level where she was when she had a debilitating flare up. Pt is frightful because she is about move to new house and has to start a new semester of teaching.  Pt feels lots of stress with this move to a new house and chaos environment without knowing where her things are because they are packed. It  feels out of control  I feel stress has something to do with my low back pain:                                                                                                                                          SUBJECTIVE STATEMENT ON EVAL 04/25/24 :  1 ) B LBP  : at its worst 7/10 after sitting 2 hours, Pain now only is located across the back and hips,  When it radiates to the top upper posterior thigh occurs when she has been sitting   Teaches yoga 1 class a week, take classes 2-3  x week, one pilates class  2) Frequent urination  - within 2 hours, 5  x .  Pt read an article that indicated  Power peeing with using her abdominal muscles to pee and the need to stop doing that. Pt realized she had been doing that for a long time.  Pt reports she finds it difficult to relax her pelvic floor to pee.       3)  SUI :  with sneezing . New in the last 2 years.  Pt does not  wear pads.   4)  incomplete emptying urine : pt has to return to pee again after the first time. This occurs 50% of the time    5)  Loose stools,   type 4 occurring once a week,  Type 6-7 occurs every time,  BMs occur 3 x a day . Pt has struggled with BMs her whole life.  Pt used to go between constipation and diarrhea a lot.    Daily fluid : water not enough 42 fl oz .  32 fl oz coffee, no juice, sodas ,   6) pain with sexual intercourse  -Did pelvic floor therapy 10 years ago with 3 months of session, therapist mostly focused on manual Tx internally and use of dilators. Pt did not enjoy using the dilators.    PERTINENT HISTORY:  R sciatic leg pain and loss of sensation lasted Feb-April 2024 , Chiropractic T x helped to regain sensation. Pain now only is located across the back and hips,  When it radiates to the top upper posterior thigh occurs when she has been sitting for 9 hrs/ day at a conference.    PAIN:  Are you having pain? Yes: see above   PRECAUTIONS: None  WEIGHT BEARING RESTRICTIONS: No  FALLS:   Has patient fallen in last 6 months?  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   PLOF: pain with sexual intercourse worsened as she got older    PATIENT GOALS:     OBJECTIVE:     [facility] PT Assessment - 05/22/24 1101       Observation/Other Assessments   Observations leg length ASIS to medial malleoli: 78 cm      Coordination   Coordination and Movement Description tendency for forward head position with cervical movements in supine and standing, upper trap elevation with chest breathing, belly breathing with extensor overuse      AROM   Overall AROM Comments B sideflexin and rotation no pinching complaints no LBP, sideflexion L 49 cm, R 47 cm digit IIII to floor      Palpation   Spinal mobility standing  : R iliac/ R shoulder lowered  ,  supine: R iliac/ ASIS, R low rib, lowered   ( post Tx: levelled shoulder/ pelvis)     SI assessment  C/T junction hypomobile, T1-3 deviated to L, playsmus tightness, elevated L clavicle, occiput tightness B          [facility] Adult PT Treatment/Exercise - 05/22/24 1138       Therapeutic Activites    Other Therapeutic Activities biopyschosocial apporaches to manage stress during time if changes in her life: moving into a new house Aug 7 while waiting on realtor and financial team to communicate with each other , starting a new semester as professor, wanting to continue with  feeling less pain in her body and knowing that  husband not wanting her to experience pain with sexual intercourse      Neuro Re-ed    Neuro Re-ed Details  cued for body awareness, downregulation of nn nervous with awareness of exhalation, depression of upper trap, more diaphragm breathing, body position and placement between position changes from standing to supine, sitting  , cued for posterior excursion of  diaphragm breathing instad of belly breathing and tightening estensor mm,      Modalities   Modalities Moist Heat      Moist Heat Therapy   Number Minutes Moist Heat 10 Minutes    Moist Heat Location --   neck/ midback, legs propped for relaxation, ( unbilled)     Manual Therapy   Manual therapy comments distraction at occiput, STM/ / MWM MWM at problem areas noted in assessment to realign C/T junction                 HOME EXERCISE PROGRAM: See pt instruction section    ASSESSMENT:  CLINICAL IMPRESSION:   Improvements across past 3 visits include:  Levelled shoulders and pelvic girdle and improved sideflexion without pinching LBP  Increased propioception in standing posture with less hyperextended knees, more co-activation of transverse arches, and decreased upper trap overuse /chest breathing and more diaphragmatic breathing to optimize IAP and relaxation / less body tensions                Continued to address misalignment of spine/ pelvis with assessment and manual Tx to C/T junction. Pt had Hx of shoulder injury from practicing yoga when she was holding planks for long periods. She adjusts for R shoulder pain. This is likely related to her elevated L side.   Pt's L shoulder, low rib, iliac crest, medial malleoli were higher than R in both standing and supine. Post Tx, shoulders and pelvis were levelled.  Confirmed again that leg length difference is not involved.   Manual Tx was modified to accommodate comfort and not increase tenderness and pain. Pt showed levelled shoulders and pelvis     Cued for body awareness, downregulation of nn nervous with awareness of exhalation, depression of upper trap, more diaphragm breathing, body position and placement between position changes from standing to supine, sitting  , cued for posterior excursion of diaphragm breathing instead of belly breathing and tightening extensor mm in hooklying position and standing .,  Plan to  advance to deep core training at upcoming sessions.   Pt demo'd improved depression of upper trap and less tensions in body in upright posture. Pt was tearful at beginning of session when expression her concerns and fear of nearing close to flare up. Provided active listening, compassionate listening and guided pt on relaxation responses to minimize anxiety/ fear of pain.  Continued with neuroscience of pain education /biopsychosocial approaches  today and  plan to incorporate  at upcoming sessions.                 These improvements made today will help promote optimize IAP system for improved pelvic floor function, trunk stability, gait, balance, stabilization with mobility tasks.                 Plan to address pelvic floor issues once pelvis and spine are realigned to yield better outcomes.  Regional interdependent approaches will yield greater benefits in pt's POC.  Pt benefits from skilled PT.    OBJECTIVE IMPAIRMENTS decreased activity tolerance, decreased coordination, decreased endurance, decreased mobility, difficulty walking, decreased ROM, decreased strength, decreased safety awareness, hypomobility, increased muscle spasms, impaired flexibility, improper body mechanics, postural dysfunction, and pain.   ACTIVITY LIMITATIONS  self-care,  driving and sitting    PARTICIPATION LIMITATIONS:  community  activities    PERSONAL FACTORS   affecting patient's functional outcome:    REHAB POTENTIAL: Good   CLINICAL DECISION MAKING: Evolving/moderate complexity   EVALUATION COMPLEXITY: Moderate    PATIENT EDUCATION:    Education details: Showed pt anatomy images. Explained muscles attachments/ connection, physiology of deep core system/ spinal- thoracic-pelvis-lower kinetic chain as they relate to pt's presentation, Sx, and past Hx. Explained what and how these areas of deficits need to be restored to balance and function    See  Therapeutic activity / neuromuscular re-education section  Answered pt's questions.   Person educated: Patient Education method: Explanation, Demonstration, Tactile cues, Verbal cues, and Handouts Education comprehension: verbalized understanding, returned demonstration, verbal cues required, tactile cues required, and needs further education     PLAN: PT FREQUENCY: 1x/week   PT DURATION: 10 weeks   PLANNED INTERVENTIONS:   Gait training;Stair training;Functional mobility training;DME Instruction;Therapeutic activities;Therapeutic exercise;Balance training;Neuromuscular re-education;Patient/family education;Vestibular;Visual/perceptual remediation/compensation;Passive range of motion;Moist Heat;Cryotherapy;Traction;Canalith Repostioning;Joint Manipulations;Manual lymph drainage;Manual techniques;Scar mobilization;Energy conservation;Dry needling;ADLs/Self Care Home Management;Biofeedback;Electrical Stimulation;Taping    PLAN FOR NEXT SESSION: See clinical impression for plan     GOALS: Goals reviewed with patient? Yes  SHORT TERM GOALS: Target date: 05/23/2024    Pt will demo IND with HEP                    Baseline: Not IND            Goal status: INITIAL   LONG TERM GOALS: Target date: 07/04/2024    1.Pt will demo proper deep core coordination without chest breathing and optimal excursion of diaphragm/pelvic floor in order to promote spinal stability and pelvic floor function to minimize SUI and dyspareunia  Baseline: dyscoordination Goal status: INITIAL  2.  Pt will demo proper body mechanics in against gravity tasks and ADLs  work tasks, fitness ( modified yoga and pilate practice)   to minimize straining pelvic floor / back    Baseline: not IND, improper form that places strain on pelvic floor  Goal status: INITIAL   3. Pt will demo increased gait speed > 1.6  m/s with reciprocal gait pattern, longer stride length  in order to ambulate safely in community and return  to fitness routine  Baseline: 1.44 m/s, decreased stance on R, R trunk lean,  limited thoracic rotation posterior,  R anterior pelvic rotation  Goal status: INITIAL   4. Pt will demo levelled pelvic girdle and shoulder height in order to progress to deep core strengthening HEP and restore mobility at spine, pelvis, gait, posture minimize falls, and improve balance  Baseline: R shoulder/ R iliac crest lowered,   hypomobility at C/T junction with small dowagers  Goal status: INITIAL   5. Pt will report no LBP with rotation/ sideflexion and regain sideflexion bilaterally in order to perform ADLs  Baseline: L sideflexion / Rotation causing pinching at L PSIS  localized pain  4/10 rotation, 3/10 pain with sideflexion  ,  L c 50cm  digit III to the floor at the onset of pain,   R 44 cm  Goal status: INITIAL  6. Pt will report no longer needing to  return to pee a second time and able to empty urine completely 100% of the time to minimize risks for UTI and return to work tasks without interruptions Baseline:pt has to return to pee again after the first time. This occurs 50% of the time   Goal status: INITIAL   7. 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 Pt reports type 4 consistency > 50% of the time per day instead of once a week,  Type 6-7 across 50% of the time  Baseline:ype 4 occurring once a week,  Type 6-7 occurs every time,  BMs occur 3 x a day . Pt has struggled with BMs her whole life.  Pt used to go between constipation and diarrhea a lot.        Pia Lupe Plump, PT 05/22/2024, 10:34 AM

## 2024-05-29 ENCOUNTER — Ambulatory Visit: Attending: Certified Nurse Midwife | Admitting: Physical Therapy

## 2024-05-29 DIAGNOSIS — M5459 Other low back pain: Secondary | ICD-10-CM | POA: Insufficient documentation

## 2024-05-29 DIAGNOSIS — M533 Sacrococcygeal disorders, not elsewhere classified: Secondary | ICD-10-CM | POA: Insufficient documentation

## 2024-05-29 DIAGNOSIS — R2689 Other abnormalities of gait and mobility: Secondary | ICD-10-CM | POA: Insufficient documentation

## 2024-05-30 ENCOUNTER — Ambulatory Visit: Admitting: Physical Therapy

## 2024-05-30 DIAGNOSIS — M5459 Other low back pain: Secondary | ICD-10-CM | POA: Diagnosis present

## 2024-05-30 DIAGNOSIS — M533 Sacrococcygeal disorders, not elsewhere classified: Secondary | ICD-10-CM | POA: Diagnosis present

## 2024-05-30 DIAGNOSIS — R2689 Other abnormalities of gait and mobility: Secondary | ICD-10-CM
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
## 2024-05-30 NOTE — Patient Instructions (Addendum)
 Biopsychosocial approaches, resources on pain science emailed  Continue with psychotherapy  Create a gratitude journal and track positive changes on calendar for positive reinforcement when there are off days   __  Write down yoga poses are  __   ZigZag stretch  Reclined twist for hips and side of the hips/ legs  Lay on your back, knees bend, dig elbows and feet into bed to exhale and lift buttocks up to  Scoot hips to the L , leave shoulders in place Wobble knees to the R side 45 deg and to midline  10 reps   Then reach for R thigh with R hand cross body, straighten knee and point toes toward face, bend knee and repeat straigthen 10 reps to lengthen hamstring and ITband (side of hip)   Repeat on other side: Scoot hip R ,  leave shoulders in place Wobble knees to the L side 45 deg and to midline  10 reps   Then reach for L thigh with R hand cross body, straighten knee and point toes toward face, bend knee and repeat straigthen 10 reps to lengthen hamstring and ITband (side of hip)  __  When packing boxes:  Place them on counter or coffee table to minimize repeated bending   Lifting:  Minisquat: Scoot buttocks back slight, hinge like you are looking at your reflection on a pond  Knees behind toes,  Inhale to smell flowers  Exhale on the rise like rocket  Do not lock knees, have more weight across ballmounds of feet, toes relaxed and spread them, not grip them   10 reps x 3 x day    Lift object close to center of mass ( navel), elbows by rib, shoulder back and down    Move feet first before twist back

## 2024-06-05 ENCOUNTER — Ambulatory Visit: Admitting: Physical Therapy

## 2024-06-05 DIAGNOSIS — M5459 Other low back pain: Secondary | ICD-10-CM | POA: Diagnosis not present

## 2024-06-05 DIAGNOSIS — M533 Sacrococcygeal disorders, not elsewhere classified: Secondary | ICD-10-CM

## 2024-06-05 DIAGNOSIS — R2689 Other abnormalities of gait and mobility: Secondary | ICD-10-CM

## 2024-06-05 NOTE — Therapy (Signed)
 OUTPATIENT PHYSICAL THERAPY TREATMENT    Patient Name: April Hoffman MRN: [id number] DOB:08/17/1981, 43 y.o., female Today's Date: 06/05/2024   PT End of Session - 06/05/24 0943     Visit Number 5    Number of Visits 10    Date for PT Re-Evaluation 07/04/24    PT Start Time 0940    PT Stop Time 1020    PT Time Calculation (min) 40 min    Activity Tolerance Patient tolerated treatment well;No increased pain    Behavior During Therapy [facility] for tasks assessed/performed          No past medical history on file. Past Surgical History:  Procedure Laterality Date   GUM SURGERY     WISDOM TOOTH EXTRACTION     Patient Active Problem List   Diagnosis Date Noted   B12 deficiency 05/25/2021   GAD (generalized anxiety disorder) 05/25/2021   Major depressive disorder, recurrent episode, moderate with mixed features (HCC) 05/25/2021   Panic attacks 05/25/2021   Gastroesophageal reflux disease without esophagitis 02/03/2021   Chest pain at rest 02/03/2021   Dysphagia 02/03/2021   Anxiety 02/03/2021   Current mild episode of major depressive disorder without prior episode (HCC) 08/22/2019    PCP: Efraim MART PROVIDER: Tanda MART DIAG:  M62.89 (ICD-10-CM) - Pelvic floor tension  N94.10 (ICD-10-CM) - Dyspareunia in female    Rationale for Evaluation and Treatment   THERehabilitationRAPY DIAG:  Other low back pain  Sacrococcygeal disorders, not elsewhere classified  Other abnormalities of gait and mobility  ONSET DATE:   SUBJECTIVE:           SUBJECTIVE STATEMENT TODAY:                           Pt reports She had lots of stress with her move and also did a lot of moving boxes . On Friday ( 2 days after last session,) pt experienced shooting pain down B legs down the backs and sides of both legs which was a different pattern than before. The radiating pain was gone 3 days later. Pt then went to the DC on Monday when the radiating pain was gone and DC  helped with achiness and her stiff neck.   After Wednesday after the PT Tx, pt felt really goodI feel loose and my body felt more fluid. There was a little bit of back pain and there is always a little bit of back pain.  But then she had to deal with all the choas related t her home closing because her attorney messed up everything  . Pt and husband had to do many of the tasks the lawyer was suppose to do because some documents were lost.          Pt did not sleep Thursday, Friday, Sat night. Pt took a medication to sleep deep. She woke up with a stiff neck and the DC helped with it. It is both sides of the neck and coming down both shoulders. Pt denied numbness and tingling  SUBJECTIVE STATEMENT ON EVAL 04/25/24 :  1 ) B LBP  : at its worst 7/10 after sitting 2 hours, Pain now only is located across the back and hips,  When it radiates to the top upper posterior thigh occurs when she has been sitting   Teaches yoga 1 class a week, 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  Pt read an article that indicated  Power peeing with using her abdominal muscles to pee and the need to stop doing that. Pt realized she had been doing that for a long time.  Pt reports she finds it difficult to relax her pelvic floor to pee.       3)  SUI :  with sneezing . New in the last 2 years.  Pt does not  wear pads.   4)  incomplete emptying urine : pt has to return to pee again after the first time. This occurs 50% of the time    5)  Loose stools,   type 4 occurring once a week,  Type 6-7 occurs every time,  BMs occur 3 x a day . Pt has struggled with BMs her whole life.  Pt used to go between constipation and diarrhea a lot.    Daily fluid : water not enough 42 fl oz .  32 fl oz coffee, no juice, sodas ,   6) pain with sexual intercourse  -Did pelvic floor  therapy 10 years ago with 3 months of session, therapist mostly focused on manual Tx internally and use of dilators. Pt did not enjoy using the dilators.    PERTINENT HISTORY:  R sciatic leg pain and loss of sensation lasted Feb-April 2024 , Chiropractic T x helped to regain sensation. Pain now only is located across the back and hips,  When it radiates to the top upper posterior thigh occurs when she has been sitting for 9 hrs/ day at a conference.    PAIN:  Are you having pain? Yes: see above   PRECAUTIONS: None  WEIGHT BEARING RESTRICTIONS: No  FALLS:  Has patient fallen in last 6 months?  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   PLOF: pain with sexual intercourse worsened as she got older    PATIENT GOALS:     OBJECTIVE:    [facility] PT Assessment - 06/05/24 0952       Palpation   Spinal mobility Standing, R shoulder slightly lowered, pelvis levelled    Palpation comment deviated T3-5 L , tightness along interspinals, scalenes, occiput, L          [facility] Adult PT Treatment/Exercise - 06/05/24 1020       Self-Care   Other Self-Care Comments  active listening to updates to response to last Tx, Sx from her moving into new home and stress      Manual Therapy   Manual therapy comments STM/MWM , medial glide at T3-5 to promote realignment of spine            See pt instruction section    ASSESSMENT:  CLINICAL IMPRESSION:   Improvements across past 5 visits include:  Levelled shoulders and pelvic girdle and improved sideflexion without pinching LBP  Increased propioception in standing posture  with less hyperextended knees, more co-activation of transverse arches, and decreased upper trap overuse /chest breathing and more diaphragmatic breathing to optimize IAP and relaxation / less body  tensions Improved reciporcal gait    Continued to apply biopyshcosocial approaches for pain management , explained nervous system and role of symp/ parasympathetic systems      Pt had a positive reaponse to Tx last week. Pt continues to demo levelled pelvis and shoulder but L quadrant remains hypomobile.  Addressed derivations to upper T spine segments. Pt demo'd improved mobility and realignment of T3-5. Pt showed more mobility post Tx.    Plan to address at upcoming sessions:     Pt had Hx of shoulder injury from practicing yoga when she was holding planks for long periods in 2017. She adjusts for R shoulder pain. This is likely related to her elevated L side.  Pt had some PT and it did not get better. No imaging was done. Infraspinatus and teres minor mm were words told to her by PT and massage therapist but not imaging was done to confirm any structures causing the pain, Currently, pt avoids [place] nor upward facing dog in her yoga practice. Pt avoids lifting overhead.  Pt stopped using weights overhead.   Pain with R shoulder with these activities 5/10 located at the psoterior and medial deltoid area ( pt points) and is not radiating.             Pain at its best 0/10 pain level most of the time when not aggravating.      Plan to add propicoeption and training of upper quadrant in yoga and lifting practices to minimize guarding behavorial which is likely contributing to pt's deviations and tightness to L side of thoracic area. Lower trunk rotation to stretch cross diagonal plane of L thoracic / R hip area helped to minimize pinching sensation with L spinal rotation to L when in standing.  Regional interdependent approaches will yield greater benefits in pt's POC.   Plan to advance to deep core training at upcoming sessions.   Pt demo'd improved depression of upper trap and less tensions in body in upright posture. Pt was tearful at beginning of session when expression her concerns  and fear of nearing close to flare up. Provided active listening, compassionate listening and guided pt on relaxation responses to minimize anxiety/ fear of pain.  Continued with neuroscience of pain education /biopsychosocial approaches  today and  plan to incorporate  at upcoming sessions.                 These improvements made today will help promote optimize IAP system for improved pelvic floor function, trunk stability, gait, balance, stabilization with mobility tasks.                 Plan to address pelvic floor issues once pelvis and spine are realigned to yield better outcomes.                                                      Pt benefits from skilled PT.    OBJECTIVE IMPAIRMENTS decreased activity tolerance, decreased coordination, decreased endurance, decreased mobility, difficulty walking, decreased ROM, decreased strength, decreased safety awareness, hypomobility, increased muscle spasms, impaired flexibility, improper body mechanics, postural dysfunction, and pain.   ACTIVITY LIMITATIONS  self-care,  driving and sitting    PARTICIPATION LIMITATIONS:  community  activities    PERSONAL FACTORS   affecting patient's functional outcome:    REHAB POTENTIAL: Good   CLINICAL DECISION MAKING: Evolving/moderate complexity   EVALUATION COMPLEXITY: Moderate    PATIENT EDUCATION:    Education details: Showed pt anatomy images. Explained muscles attachments/ connection, physiology of deep core system/ spinal- thoracic-pelvis-lower kinetic chain as they relate to pt's presentation, Sx, and past Hx. Explained what and how these areas of deficits need to be restored to balance and function    See Therapeutic activity / neuromuscular re-education section  Answered pt's questions.   Person educated: Patient Education method: Explanation, Demonstration, Tactile cues, Verbal cues, and Handouts Education comprehension: verbalized understanding, returned demonstration, verbal cues  required, tactile cues required, and needs further education     PLAN: PT FREQUENCY: 1x/week   PT DURATION: 10 weeks   PLANNED INTERVENTIONS:   Gait training;Stair training;Functional mobility training;DME Instruction;Therapeutic activities;Therapeutic exercise;Balance training;Neuromuscular re-education;Patient/family education;Vestibular;Visual/perceptual remediation/compensation;Passive range of motion;Moist Heat;Cryotherapy;Traction;Canalith Repostioning;Joint Manipulations;Manual lymph drainage;Manual techniques;Scar mobilization;Energy conservation;Dry needling;ADLs/Self Care Home Management;Biofeedback;Electrical Stimulation;Taping    PLAN FOR NEXT SESSION: See clinical impression for plan     GOALS: Goals reviewed with patient? Yes  SHORT TERM GOALS: Target date: 05/23/2024    Pt will demo IND with HEP                    Baseline: Not IND            Goal status: INITIAL   LONG TERM GOALS: Target date: 07/04/2024    1.Pt will demo proper deep core coordination without chest breathing and optimal excursion of diaphragm/pelvic floor in order to promote spinal stability and pelvic floor function to minimize SUI and dyspareunia  Baseline: dyscoordination Goal status: INITIAL  2.  Pt will demo proper body mechanics in against gravity tasks and ADLs  work tasks, fitness ( modified yoga and pilate practice)   to minimize straining pelvic floor / back    Baseline: not IND, improper form that places strain on pelvic floor  Goal status: INITIAL   3. Pt will demo increased gait speed > 1.6  m/s with reciprocal gait pattern, longer stride length  in order to ambulate safely in community and return to fitness routine  Baseline: 1.44 m/s, decreased stance on R, R trunk lean,  limited thoracic rotation posterior,  R anterior pelvic rotation  Goal status: INITIAL   4. Pt will demo levelled pelvic girdle and shoulder height in order to progress to deep core strengthening HEP and  restore mobility at spine, pelvis, gait, posture minimize falls, and improve balance  Baseline: R shoulder/ R iliac crest lowered,   hypomobility at C/T junction with small dowagers  Goal status: INITIAL   5. Pt will report no LBP with rotation/ sideflexion and regain sideflexion bilaterally in order to perform ADLs  Baseline: L sideflexion / Rotation causing pinching at L PSIS  localized pain  4/10 rotation, 3/10 pain with sideflexion  ,  L c 50cm  digit III to the floor at the onset of pain,   R 44 cm  Goal status: INITIAL  6. Pt will report no longer needing to return to pee a second time and able to empty urine completely 100% of the time to minimize risks for UTI and return to work tasks without interruptions Baseline:pt has to return to pee again after the first time. This occurs 50% of the time   Goal  status: INITIAL   7. 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 Pt reports type 4 consistency > 50% of the time per day instead of once a week,  Type 6-7 across 50% of the time  Baseline:ype 4 occurring once a week,  Type 6-7 occurs every time,  BMs occur 3 x a day . Pt has struggled with BMs her whole life.  Pt used to go between constipation and diarrhea a lot.        Pia Lupe Plump, PT 06/05/2024, 9:44 AM

## 2024-06-12 ENCOUNTER — Ambulatory Visit: Admitting: Physical Therapy

## 2024-06-13 ENCOUNTER — Ambulatory Visit: Admitting: Physical Therapy

## 2024-06-13 DIAGNOSIS — M5459 Other low back pain: Secondary | ICD-10-CM

## 2024-06-13 DIAGNOSIS — M533 Sacrococcygeal disorders, not elsewhere classified: Secondary | ICD-10-CM

## 2024-06-13 DIAGNOSIS — R2689 Other abnormalities of gait and mobility: Secondary | ICD-10-CM

## 2024-06-13 NOTE — Therapy (Addendum)
 OUTPATIENT PHYSICAL THERAPY TREATMENT    Patient Name: April Hoffman MRN: [id number] DOB:05/02/1981, 43 y.o., female Today's Date: 06/13/2024   PT End of Session - 06/13/24 1026     Visit Number 6    Number of Visits 10    Date for PT Re-Evaluation 07/04/24    PT Start Time 1020    PT Stop Time 1100    PT Time Calculation (min) 40 min    Activity Tolerance Patient tolerated treatment well;No increased pain    Behavior During Therapy [facility] for tasks assessed/performed          No past medical history on file. Past Surgical History:  Procedure Laterality Date   GUM SURGERY     WISDOM TOOTH EXTRACTION     Patient Active Problem List   Diagnosis Date Noted   B12 deficiency 05/25/2021   GAD (generalized anxiety disorder) 05/25/2021   Major depressive disorder, recurrent episode, moderate with mixed features (HCC) 05/25/2021   Panic attacks 05/25/2021   Gastroesophageal reflux disease without esophagitis 02/03/2021   Chest pain at rest 02/03/2021   Dysphagia 02/03/2021   Anxiety 02/03/2021   Current mild episode of major depressive disorder without prior episode (HCC) 08/22/2019    PCP: Efraim MART PROVIDER: Tanda MART DIAG:  M62.89 (ICD-10-CM) - Pelvic floor tension  N94.10 (ICD-10-CM) - Dyspareunia in female    Rationale for Evaluation and Treatment   THERehabilitationRAPY DIAG:  Other low back pain  Sacrococcygeal disorders, not elsewhere classified  Other abnormalities of gait and mobility  ONSET DATE:   SUBJECTIVE:           SUBJECTIVE STATEMENT TODAY:  Pt moved into new house.  Pt did not have the radiating LBP that limited her from standing with lifting and moving boxes. Pt just felt LBP and not the mm weakness that was associated with her first initial episode of debilitating LBP.                   Pt starts teaching next week and she feels her schedule will feel more stable.              SUBJECTIVE STATEMENT ON EVAL 04/25/24  :  1 ) B LBP  : at its worst 7/10 after sitting 2 hours, Pain now only is located across the back and hips,  When it radiates to the top upper posterior thigh occurs when she has been sitting   Teaches yoga 1 class a week, 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  Pt read an article that indicated  Power peeing with using her abdominal muscles to pee and the need to stop doing that. Pt realized she had been doing that for a long time.  Pt reports she finds it difficult to relax her pelvic floor to pee.       3)  SUI :  with sneezing . New in the last 2 years.  Pt does not  wear pads.   4)  incomplete emptying urine : pt has to return to pee again after the first time. This occurs 50% of the time    5)  Loose stools,   type 4 occurring once a week,  Type 6-7 occurs every time,  BMs occur 3 x a day . Pt has struggled with BMs her whole life.  Pt used to go between constipation and diarrhea  a lot.    Daily fluid : water not enough 42 fl oz .  32 fl oz coffee, no juice, sodas ,   6) pain with sexual intercourse  -Did pelvic floor therapy 10 years ago with 3 months of session, therapist mostly focused on manual Tx internally and use of dilators. Pt did not enjoy using the dilators.    PERTINENT HISTORY:  R sciatic leg pain and loss of sensation lasted Feb-April 2024 , Chiropractic T x helped to regain sensation. Pain now only is located across the back and hips,  When it radiates to the top upper posterior thigh occurs when she has been sitting for 9 hrs/ day at a conference.    PAIN:  Are you having pain? Yes: see above   PRECAUTIONS: None  WEIGHT BEARING RESTRICTIONS: No  FALLS:  Has patient fallen in last 6 months?  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    PLOF: pain with sexual intercourse worsened as she got older    PATIENT GOALS:     OBJECTIVE:     [facility] PT Assessment - 06/13/24 1032       Coordination   Coordination and Movement Description scapular adduction: scapula L delayed      AROM   Overall AROM Comments L rotation and sideflexion with pinching      Palpation   Spinal mobility levelled shoulders and pelvis in standing    Palpation comment C3-7 deviated , T3-5 L  deviated and hypomobile, interpsinal and occiput tightness L          [facility] Adult PT Treatment/Exercise - 06/13/24 1113       Self-Care   Other Self-Care Comments  positive reinforcement with biopyschosocial approaches ( gratitude journal, tracking improvements,  pausing between activities , mindfulness) to manage stress and minimize tightness of body      Manual Therapy   Manual therapy comments STM/MWM at occiput / depression at clavicle to lengthen and mobilize C/T junction mobilize medial scapula ( medial glide at T segments                See pt instruction section    ASSESSMENT:  CLINICAL IMPRESSION:   Improvements across past 6 visits include:  Levelled shoulders and pelvic girdle and improved sideflexion without pinching LBP  Increased propioception in standing posture with less hyperextended knees, more co-activation of transverse arches, and decreased upper trap overuse /chest breathing and more diaphragmatic breathing to optimize IAP and relaxation / less body tensions Improved reciporcal gait  Pt was able to lift and move boxes during her move without the weakness and debilitating radiating LBP that she had with the first episode of LBP Feb 2024 and second relapse in June 2024.      Pt had a positive reaponse to Tx last week. Pt continues to demo levelled pelvis and shoulder but L quadrant remains hypomobile.  Addressed derivations to upper T spine segments on L side. Pt demo'd improved mobility and realignment of T3-5.  Pt showed more mobility post Tx. Scapular mobility with shoulder adduction was no longer asymmetrical post Tx    Plan to address at upcoming sessions:  Plan to add propicoeption and training of upper quadrant in yoga and lifting practices to minimize guarding behavorial which is likely contributing to pt's  deviations and tightness to L side of thoracic area. Lower trunk rotation to stretch cross diagonal plane of L thoracic / R hip area helped to minimize pinching sensation with L spinal rotation to L when in standing.  Regional interdependent approaches will yield greater benefits in pt's POC.   Plan to advance to deep core training at upcoming sessions.                  These improvements made today will help promote optimize IAP system for improved pelvic floor function, trunk stability, gait, balance, stabilization with mobility tasks.                 Plan to address pelvic floor issues once pelvis and spine are realigned to yield better outcomes.                                                      Pt benefits from skilled PT.    OBJECTIVE IMPAIRMENTS decreased activity tolerance, decreased coordination, decreased endurance, decreased mobility, difficulty walking, decreased ROM, decreased strength, decreased safety awareness, hypomobility, increased muscle spasms, impaired flexibility, improper body mechanics, postural dysfunction, and pain.   ACTIVITY LIMITATIONS  self-care,  driving and sitting    PARTICIPATION LIMITATIONS:  community  activities    PERSONAL FACTORS   affecting patient's functional outcome:    REHAB POTENTIAL: Good   CLINICAL DECISION MAKING: Evolving/moderate complexity   EVALUATION COMPLEXITY: Moderate    PATIENT EDUCATION:    Education details: Showed pt anatomy images. Explained muscles attachments/ connection, physiology of deep core system/ spinal- thoracic-pelvis-lower kinetic chain as they relate to pt's presentation, Sx, and past Hx. Explained what  and how these areas of deficits need to be restored to balance and function    See Therapeutic activity / neuromuscular re-education section  Answered pt's questions.   Person educated: Patient Education method: Explanation, Demonstration, Tactile cues, Verbal cues, and Handouts Education comprehension: verbalized understanding, returned demonstration, verbal cues required, tactile cues required, and needs further education     PLAN: PT FREQUENCY: 1x/week   PT DURATION: 10 weeks   PLANNED INTERVENTIONS:   Gait training;Stair training;Functional mobility training;DME Instruction;Therapeutic activities;Therapeutic exercise;Balance training;Neuromuscular re-education;Patient/family education;Vestibular;Visual/perceptual remediation/compensation;Passive range of motion;Moist Heat;Cryotherapy;Traction;Canalith Repostioning;Joint Manipulations;Manual lymph drainage;Manual techniques;Scar mobilization;Energy conservation;Dry needling;ADLs/Self Care Home Management;Biofeedback;Electrical Stimulation;Taping    PLAN FOR NEXT SESSION: See clinical impression for plan     GOALS: Goals reviewed with patient? Yes  SHORT TERM GOALS: Target date: 05/23/2024    Pt will demo IND with HEP                    Baseline: Not IND            Goal status: INITIAL   LONG TERM GOALS: Target date: 07/04/2024    1.Pt will demo proper deep core coordination without chest breathing and optimal excursion of diaphragm/pelvic floor in order to promote spinal stability and pelvic floor function to minimize SUI and dyspareunia  Baseline: dyscoordination Goal status: INITIAL  2.  Pt will demo proper body mechanics in against gravity tasks and ADLs  work tasks, fitness ( modified yoga and pilate practice)   to minimize straining pelvic floor / back    Baseline: not IND, improper form that places strain on pelvic floor  Goal status: INITIAL   3. Pt  will demo increased gait speed > 1.6  m/s with reciprocal  gait pattern, longer stride length  in order to ambulate safely in community and return to fitness routine  Baseline: 1.44 m/s, decreased stance on R, R trunk lean,  limited thoracic rotation posterior,  R anterior pelvic rotation  Goal status: INITIAL   4. Pt will demo levelled pelvic girdle and shoulder height in order to progress to deep core strengthening HEP and restore mobility at spine, pelvis, gait, posture minimize falls, and improve balance  Baseline: R shoulder/ R iliac crest lowered,   hypomobility at C/T junction with small dowagers  Goal status: INITIAL   5. Pt will report no LBP with rotation/ sideflexion and regain sideflexion bilaterally in order to perform ADLs  Baseline: L sideflexion / Rotation causing pinching at L PSIS  localized pain  4/10 rotation, 3/10 pain with sideflexion  ,  L c 50cm  digit III to the floor at the onset of pain,   R 44 cm  Goal status: INITIAL  6. Pt will report no longer needing to return to pee a second time and able to empty urine completely 100% of the time to minimize risks for UTI and return to work tasks without interruptions Baseline:pt has to return to pee again after the first time. This occurs 50% of the time   Goal status: INITIAL   7. 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 Pt reports type 4 consistency > 50% of the time per day instead of once a week,  Type 6-7 across 50% of the time  Baseline:ype 4 occurring once a week,  Type 6-7 occurs every time,  BMs occur 3 x a day . Pt has struggled with BMs her whole life.  Pt used to go between constipation and diarrhea a lot.        Pia Lupe Plump, PT 06/13/2024, 10:27 AM

## 2024-06-19 ENCOUNTER — Ambulatory Visit: Admitting: Physical Therapy

## 2024-06-20 ENCOUNTER — Ambulatory Visit: Admitting: Physical Therapy

## 2024-06-20 DIAGNOSIS — M5459 Other low back pain: Secondary | ICD-10-CM

## 2024-06-20 DIAGNOSIS — M533 Sacrococcygeal disorders, not elsewhere classified: Secondary | ICD-10-CM

## 2024-06-20 DIAGNOSIS — R2689 Other abnormalities of gait and mobility: Secondary | ICD-10-CM

## 2024-06-20 NOTE — Patient Instructions (Addendum)
 Place band in U    band under ballmounds  while laying on back w/ knees bent   Lying on back, knees bent    band under ballmounds  while laying on back w/ knees bent  W exercise  10 reps x 2 sets   Band is placed under feet, knees bent, feet are hip width apart Hold band with thumbs point out, keep upper arm and elbow touching the bed the whole time  - inhale and then exhale pull bands by bending elbows hands move in a w  (feel shoulder blades squeezing)   _________   Karolynn pose rocking    Toes tucked, shoulders down and back, on forearms , hands shoulder width apart, fingers straight, elbow back , squeeze imaginary pencils in armpit, shoulder down and away from ears   10 reps    __   3 way childs pose ( center, to the R, to the L)   5 breaths           L hand on L hip, rotate and look up at the ceiling L  ( for this week to realign spine)  you can do both sides

## 2024-06-20 NOTE — Therapy (Signed)
 OUTPATIENT PHYSICAL THERAPY TREATMENT    Patient Name: April Hoffman MRN: [id number] DOB:07/30/1981, 43 y.o., female Today's Date: 06/20/2024   PT End of Session - 06/20/24 1638     Visit Number 7    Number of Visits 10    Date for PT Re-Evaluation 07/04/24    PT Start Time 1635    PT Stop Time 1715    PT Time Calculation (min) 40 min    Activity Tolerance Patient tolerated treatment well;No increased pain    Behavior During Therapy [facility] for tasks assessed/performed          No past medical history on file. Past Surgical History:  Procedure Laterality Date   GUM SURGERY     WISDOM TOOTH EXTRACTION     Patient Active Problem List   Diagnosis Date Noted   B12 deficiency 05/25/2021   GAD (generalized anxiety disorder) 05/25/2021   Major depressive disorder, recurrent episode, moderate with mixed features (HCC) 05/25/2021   Panic attacks 05/25/2021   Gastroesophageal reflux disease without esophagitis 02/03/2021   Chest pain at rest 02/03/2021   Dysphagia 02/03/2021   Anxiety 02/03/2021   Current mild episode of major depressive disorder without prior episode (HCC) 08/22/2019    PCP: Efraim MART PROVIDER: Tanda MART DIAG:  M62.89 (ICD-10-CM) - Pelvic floor tension  N94.10 (ICD-10-CM) - Dyspareunia in female    Rationale for Evaluation and Treatment   THERehabilitationRAPY DIAG:  Other low back pain  Sacrococcygeal disorders, not elsewhere classified  Other abnormalities of gait and mobility  ONSET DATE:   SUBJECTIVE:           SUBJECTIVE STATEMENT TODAY:  Pt had no radiating LBP this past week. Pt moved plants and swept but not as much lifting as the week before when she had a challenging week last week with big move into her new house and preparing for teahcing college classes. 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  Pt sat at computer for 4 hours  with breaks and the LBP and R hip is hurting is hurting at 510 .   Pt received chiropractic Tx yesterday and it helpful.    SUBJECTIVE STATEMENT ON EVAL 04/25/24 :  1 ) B LBP  : at its worst 7/10 after sitting 2 hours, Pain now only is located across the back and hips,  When it radiates to the top upper posterior thigh occurs when she has been sitting   Teaches yoga 1 class a week, 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  Pt read an article that indicated  Power peeing with using her abdominal muscles to pee and the need to stop doing that. Pt realized she had been doing that for a long time.  Pt reports she finds it difficult to relax her pelvic floor to pee.       3)  SUI :  with sneezing . New in the last 2 years.  Pt does not  wear pads.   4)  incomplete emptying urine : pt has to return to pee again after the first time. This occurs 50% of the time    5)  Loose stools,   type 4 occurring once a week,  Type 6-7 occurs every time,  BMs occur 3 x a day . Pt has struggled  with BMs her whole life.  Pt used to go between constipation and diarrhea a lot.    Daily fluid : water not enough 42 fl oz .  32 fl oz coffee, no juice, sodas ,   6) pain with sexual intercourse  -Did pelvic floor therapy 10 years ago with 3 months of session, therapist mostly focused on manual Tx internally and use of dilators. Pt did not enjoy using the dilators.    PERTINENT HISTORY:  R sciatic leg pain and loss of sensation lasted Feb-April 2024 , Chiropractic T x helped to regain sensation. Pain now only is located across the back and hips,  When it radiates to the top upper posterior thigh occurs when she has been sitting for 9 hrs/ day at a conference.    PAIN:  Are you having pain? Yes: see above   PRECAUTIONS: None  WEIGHT BEARING RESTRICTIONS: No  FALLS:  Has patient fallen in last 6 months?  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   PLOF: pain with sexual intercourse worsened as she got older    PATIENT GOALS:     OBJECTIVE:   [facility] PT Assessment - 06/20/24 1741       Observation/Other Assessments   Observations cervical endurance test : 18.26 sec at point of R shoulder tightness , poor cervicoscapular retraction, overuse of upper trap   Poor thoracic ext coordination, tendency to get into lumbar lordosis in new HEP, withholding and downgraded      Palpation   Spinal mobility levelled pelvis and slightly lowered R shoulder,    Palpation comment hypomobile T3, T10 , interspinal medial scapula  L  tightness          [facility] Adult PT Treatment/Exercise - 06/20/24 1744       Neuro Re-ed    Neuro Re-ed Details  excessive cues for proper technique and form and propioception for cervicoscapular to minimize overuse of upper trap , cued for stretches to promote realignment of thoracic      Modalities   Modalities Moist Heat      Moist Heat Therapy   Number Minutes Moist Heat 5 Minutes    Moist Heat Location --   thoracic ( unbilled) in legs elevated in restorative pose     Manual Therapy   Manual therapy comments PA /AP mob grade / medial glide at T3, 10, STM/MWM at thoracic mm interspinals medial scapula  L            See pt instruction section    ASSESSMENT:  CLINICAL IMPRESSION:   Improvements across past 7 visits include:  Levelled shoulders and pelvic girdle and improved sideflexion without pinching LBP  Increased propioception in standing posture with less hyperextended knees, more co-activation of transverse arches, and decreased upper trap overuse /chest breathing and more diaphragmatic breathing to optimize IAP and relaxation / less body tensions Improved reciporcal gait  Pt was able to lift and move boxes during her move without  the weakness and debilitating radiating LBP that she had with the first episode of LBP Feb 2024 and second relapse in June 2024.  Today , continued to further apply manual Tx to realign thoracic segments that are deviated to L and to decrease interspinal mm tightness.                                    Addressed with cues for propioception and alignment for cervicoscapular control as pt demo'd poor thoracic ext coordination, tendency to get into lumbar lordosis in new HEP, withholding and downgraded HEP.              Pt demo'd correct form for resistance HEP with red band for hooklying cervicoscapular stabilization with excessive cues for less upper trap overuse.     Plan to address at upcoming sessions:   Plan to add propicoeption and training of upper quadrant in yoga and lifting practices to minimize guarding behavorial which is likely contributing to pt's deviations and tightness to L side of thoracic area. Lower trunk rotation to stretch cross diagonal plane of L thoracic / R hip area helped to minimize pinching sensation with L spinal rotation to L when in standing.  Regional interdependent approaches will yield greater benefits in pt's POC.   Plan to advance to deep core training at upcoming sessions.                 These improvements made today will help promote optimize IAP system for improved pelvic floor function, trunk stability, gait, balance, stabilization with mobility tasks.                 Plan to address pelvic floor issues once pelvis and spine are realigned to yield better outcomes.                                                      Pt benefits from skilled PT.    OBJECTIVE IMPAIRMENTS decreased activity tolerance, decreased coordination, decreased endurance, decreased mobility, difficulty walking, decreased ROM, decreased strength, decreased safety awareness, hypomobility, increased muscle spasms, impaired flexibility, improper  body mechanics, postural dysfunction, and pain.   ACTIVITY LIMITATIONS  self-care,  driving and sitting    PARTICIPATION LIMITATIONS:  community  activities    PERSONAL FACTORS   affecting patient's functional outcome:    REHAB POTENTIAL: Good   CLINICAL DECISION MAKING: Evolving/moderate complexity   EVALUATION COMPLEXITY: Moderate    PATIENT EDUCATION:    Education details: Showed pt anatomy images. Explained muscles attachments/ connection, physiology of deep core system/ spinal- thoracic-pelvis-lower kinetic chain as they relate to pt's presentation, Sx, and past Hx. Explained what and how these areas of deficits need to be restored to balance and function    See Therapeutic activity / neuromuscular re-education section  Answered pt's questions.   Person educated: Patient Education method: Explanation, Demonstration, Tactile cues, Verbal cues, and Handouts Education comprehension: verbalized understanding, returned demonstration, verbal cues required, tactile cues required, and needs further education     PLAN: PT FREQUENCY: 1x/week   PT DURATION: 10 weeks   PLANNED INTERVENTIONS:   Gait training;Stair training;Functional mobility training;DME Instruction;Therapeutic activities;Therapeutic exercise;Balance training;Neuromuscular re-education;Patient/family education;Vestibular;Visual/perceptual remediation/compensation;Passive range of motion;Moist Heat;Cryotherapy;Traction;Canalith Repostioning;Joint Manipulations;Manual lymph drainage;Manual techniques;Scar mobilization;Energy conservation;Dry needling;ADLs/Self Care Home Management;Biofeedback;Electrical Stimulation;Taping    PLAN FOR NEXT SESSION: See clinical impression for plan     GOALS: Goals reviewed with  patient? Yes  SHORT TERM GOALS: Target date: 05/23/2024    Pt will demo IND with HEP                    Baseline: Not IND            Goal status: INITIAL   LONG TERM GOALS: Target date:  07/04/2024    1.Pt will demo proper deep core coordination without chest breathing and optimal excursion of diaphragm/pelvic floor in order to promote spinal stability and pelvic floor function to minimize SUI and dyspareunia  Baseline: dyscoordination Goal status: INITIAL  2.  Pt will demo proper body mechanics in against gravity tasks and ADLs  work tasks, fitness ( modified yoga and pilate practice)   to minimize straining pelvic floor / back    Baseline: not IND, improper form that places strain on pelvic floor  Goal status: INITIAL   3. Pt will demo increased gait speed > 1.6  m/s with reciprocal gait pattern, longer stride length  in order to ambulate safely in community and return to fitness routine  Baseline: 1.44 m/s, decreased stance on R, R trunk lean,  limited thoracic rotation posterior,  R anterior pelvic rotation  Goal status: INITIAL   4. Pt will demo levelled pelvic girdle and shoulder height in order to progress to deep core strengthening HEP and restore mobility at spine, pelvis, gait, posture minimize falls, and improve balance  Baseline: R shoulder/ R iliac crest lowered,   hypomobility at C/T junction with small dowagers  Goal status: INITIAL   5. Pt will report no LBP with rotation/ sideflexion and regain sideflexion bilaterally in order to perform ADLs  Baseline: L sideflexion / Rotation causing pinching at L PSIS  localized pain  4/10 rotation, 3/10 pain with sideflexion  ,  L c 50cm  digit III to the floor at the onset of pain,   R 44 cm  Goal status: INITIAL  6. Pt will report no longer needing to return to pee a second time and able to empty urine completely 100% of the time to minimize risks for UTI and return to work tasks without interruptions Baseline:pt has to return to pee again after the first time. This occurs 50% of the time   Goal status: INITIAL   7. 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 Pt reports type 4 consistency > 50% of the time per day instead of once a week,  Type 6-7 across 50% of the time  Baseline:ype 4 occurring once a week,  Type 6-7 occurs every time,  BMs occur 3 x a day . Pt has struggled with BMs her whole life.  Pt used to go between constipation and diarrhea a lot.   Goal status: INITIAL  9. Pt will demo increased endurance > 30sec for cervical endurance mm in order to to return to chataraunga and other yoga practices  Baseline:cervical endurance test : 18.26 sec at point of R shoulder tightness , poor cervicoscapular retraction, overuse of upper trap  Goal status: INITIAL   Pia Lupe Plump, PT 06/20/2024, 4:39 PM

## 2024-06-26 ENCOUNTER — Ambulatory Visit: Admitting: Physical Therapy

## 2024-07-03 ENCOUNTER — Ambulatory Visit: Admitting: Physical Therapy

## 2024-07-04 ENCOUNTER — Ambulatory Visit: Attending: Certified Nurse Midwife | Admitting: Physical Therapy

## 2024-07-04 DIAGNOSIS — M533 Sacrococcygeal disorders, not elsewhere classified: Secondary | ICD-10-CM | POA: Insufficient documentation

## 2024-07-04 DIAGNOSIS — M5459 Other low back pain: Secondary | ICD-10-CM | POA: Diagnosis present

## 2024-07-04 DIAGNOSIS — R2689 Other abnormalities of gait and mobility: Secondary | ICD-10-CM | POA: Insufficient documentation

## 2024-07-04 NOTE — Patient Instructions (Signed)
 Deep core level 1 only this week

## 2024-07-04 NOTE — Therapy (Signed)
 OUTPATIENT PHYSICAL THERAPY TREATMENT    Patient Name: Kasaundra Fahrney MRN: [id number] DOB:07/26/1981, 43 y.o., female Today's Date: 07/04/2024   PT End of Session - 07/04/24 1636     Visit Number 8    Number of Visits 10    Date for PT Re-Evaluation 07/04/24    PT Start Time 1633    PT Stop Time 1718    PT Time Calculation (min) 45 min    Activity Tolerance Patient tolerated treatment well;No increased pain    Behavior During Therapy [facility] for tasks assessed/performed           No past medical history on file. Past Surgical History:  Procedure Laterality Date   GUM SURGERY     WISDOM TOOTH EXTRACTION     Patient Active Problem List   Diagnosis Date Noted   B12 deficiency 05/25/2021   GAD (generalized anxiety disorder) 05/25/2021   Major depressive disorder, recurrent episode, moderate with mixed features (HCC) 05/25/2021   Panic attacks 05/25/2021   Gastroesophageal reflux disease without esophagitis 02/03/2021   Chest pain at rest 02/03/2021   Dysphagia 02/03/2021   Anxiety 02/03/2021   Current mild episode of major depressive disorder without prior episode (HCC) 08/22/2019    PCP: Efraim   REFERRING PROVIDER: Tanda MART DIAG:  M62.89 (ICD-10-CM) - Pelvic floor tension  N94.10 (ICD-10-CM) - Dyspareunia in female    Rationale for Evaluation and Treatment   THERehabilitationRAPY DIAG:  Other low back pain  Sacrococcygeal disorders, not elsewhere classified  Other abnormalities of gait and mobility  ONSET DATE:   SUBJECTIVE:           SUBJECTIVE STATEMENT TODAY:  Pt got COVID last week and had to skip PT  Pt did not get the cough  Pt noticed her urinary frequency is getting better  Pt is doing her HEP    SUBJECTIVE STATEMENT ON EVAL 04/25/24 :  1 ) B LBP  : at its worst 7/10 after sitting 2 hours, Pain now only is located across the back and hips,  When it radiates to the top upper posterior thigh occurs when she has been sitting    Teaches yoga 1 class a week, 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  Pt read an article that indicated  Power peeing with using her abdominal muscles to pee and the need to stop doing that. Pt realized she had been doing that for a long time.  Pt reports she finds it difficult to relax her pelvic floor to pee.       3)  SUI :  with sneezing . New in the last 2 years.  Pt does not  wear pads.   4)  incomplete emptying urine : pt has to return to pee again after the first time. This occurs 50% of the time    5)  Loose stools,   type 4 occurring once a week,  Type 6-7 occurs every time,  BMs occur 3 x a day . Pt has struggled with BMs her whole life.  Pt used to go between constipation and diarrhea a lot.    Daily fluid : water not enough 42 fl oz .  32 fl oz coffee, no juice, sodas ,   6) pain with sexual intercourse  -Did pelvic floor therapy 10 years ago with 3 months of session, therapist mostly focused on manual Tx internally and  use of dilators. Pt did not enjoy using the dilators.    PERTINENT HISTORY:  R sciatic leg pain and loss of sensation lasted Feb-April 2024 , Chiropractic T x helped to regain sensation. Pain now only is located across the back and hips,  When it radiates to the top upper posterior thigh occurs when she has been sitting for 9 hrs/ day at a conference.    PAIN:  Are you having pain? Yes: see above   PRECAUTIONS: None  WEIGHT BEARING RESTRICTIONS: No  FALLS:  Has patient fallen in last 6 months?  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   PLOF: pain with sexual intercourse worsened as she got older    PATIENT GOALS:     OBJECTIVE:   [facility] PT Assessment - 07/04/24 1638       Coordination   Coordination and Movement  Description scapular adduction: scapula L not as delayed, also oblique overuse with deep core training level 1      AROM   Overall AROM Comments No more pinching with L rotation , only with L sideflexion  , R 43 cm, L 46 cm digit III to floor      Palpation   Spinal mobility levelled pelvis ,  R shoulder slightly lowered    Palpation comment T3 medially aligned but hypombily C5-7/T1,  T10-11 deviated and hypombile at L , limited excursion at L t9-10 anterior/lateral/posterior, occiput/levator/scalenes mm L          [facility] Adult PT Treatment/Exercise - 07/04/24 1712       Neuro Re-ed    Neuro Re-ed Details  cued for less ab overuse with deep core level 1, cued for propioception of posterior thorax and diaphragm excursion posterior, lateral, anterior B , cued for deep core level 1 wihtless ab overuse, cued for gradual transition between positions to carry relaxed state      Manual Therapy   Manual therapy comments distraction at occiput, STM/MWM / PA mob grade III at problem areas noted in assessment to promote mobility at C/T junction and medial alignment             See pt instruction section    ASSESSMENT:  CLINICAL IMPRESSION:   Improvements across past 8 visits include:  Levelled shoulders and pelvic girdle and improved sideflexion without pinching LBP  Increased propioception in standing posture with less hyperextended knees, more co-activation of transverse arches, and decreased upper trap overuse /chest breathing and more diaphragmatic breathing to optimize IAP and relaxation / less body tensions Improved reciporcal gait  Pt was able to lift and move boxes during her move without the weakness and debilitating radiating LBP that she had with the first episode of LBP Feb 2024 and second relapse in June 2024.    Today:  Pt is showing less deviation of spine at C/T and Tl/L junction but still required more manual Tx today to further release tightness at L side. Pt  progressed to deep core level 1 with cues for optimal excursion of diaphragm and less overuse of ab    Plan to address at upcoming sessions:  Progress to deep core level and multifidis strengthening  next session.   Plan to add propicoeption and training of upper quadrant in yoga and lifting practices to minimize guarding behavorial which  is likely contributing to pt's deviations and tightness to L side of thoracic area.   Regional interdependent approaches will yield greater benefits in pt's POC.                   These improvements made today will help promote optimize IAP system for improved pelvic floor function, trunk stability, gait, balance, stabilization with mobility tasks.                 Plan to address pelvic floor issues once pelvis and spine are realigned to yield better outcomes.                                                      Pt benefits from skilled PT.    OBJECTIVE IMPAIRMENTS decreased activity tolerance, decreased coordination, decreased endurance, decreased mobility, difficulty walking, decreased ROM, decreased strength, decreased safety awareness, hypomobility, increased muscle spasms, impaired flexibility, improper body mechanics, postural dysfunction, and pain.   ACTIVITY LIMITATIONS  self-care,  driving and sitting    PARTICIPATION LIMITATIONS:  community  activities    PERSONAL FACTORS   affecting patient's functional outcome:    REHAB POTENTIAL: Good   CLINICAL DECISION MAKING: Evolving/moderate complexity   EVALUATION COMPLEXITY: Moderate    PATIENT EDUCATION:    Education details: Showed pt anatomy images. Explained muscles attachments/ connection, physiology of deep core system/ spinal- thoracic-pelvis-lower kinetic chain as they relate to pt's presentation, Sx, and past Hx. Explained what and how these areas of deficits need to be restored to balance and function    See Therapeutic activity / neuromuscular re-education section  Answered pt's  questions.   Person educated: Patient Education method: Explanation, Demonstration, Tactile cues, Verbal cues, and Handouts Education comprehension: verbalized understanding, returned demonstration, verbal cues required, tactile cues required, and needs further education     PLAN: PT FREQUENCY: 1x/week   PT DURATION: 10 weeks   PLANNED INTERVENTIONS:   Gait training;Stair training;Functional mobility training;DME Instruction;Therapeutic activities;Therapeutic exercise;Balance training;Neuromuscular re-education;Patient/family education;Vestibular;Visual/perceptual remediation/compensation;Passive range of motion;Moist Heat;Cryotherapy;Traction;Canalith Repostioning;Joint Manipulations;Manual lymph drainage;Manual techniques;Scar mobilization;Energy conservation;Dry needling;ADLs/Self Care Home Management;Biofeedback;Electrical Stimulation;Taping    PLAN FOR NEXT SESSION: See clinical impression for plan     GOALS: Goals reviewed with patient? Yes  SHORT TERM GOALS: Target date: 05/23/2024    Pt will demo IND with HEP                    Baseline: Not IND            Goal status: INITIAL   LONG TERM GOALS: Target date: 07/04/2024    1.Pt will demo proper deep core coordination without chest breathing and optimal excursion of diaphragm/pelvic floor in order to promote spinal stability and pelvic floor function to minimize SUI and dyspareunia  Baseline: dyscoordination Goal status: INITIAL  2.  Pt will demo proper body mechanics in against gravity tasks and ADLs  work tasks, fitness ( modified yoga and pilate practice)   to minimize straining pelvic floor / back    Baseline: not IND, improper form that places strain on pelvic floor  Goal status: INITIAL   3. Pt will demo increased gait speed > 1.6  m/s with reciprocal gait pattern, longer stride length  in order to ambulate safely in community and return to fitness routine  Baseline: 1.44 m/s, decreased  stance on R, R trunk  lean,  limited thoracic rotation posterior,  R anterior pelvic rotation  Goal status: INITIAL   4. Pt will demo levelled pelvic girdle and shoulder height in order to progress to deep core strengthening HEP and restore mobility at spine, pelvis, gait, posture minimize falls, and improve balance  Baseline: R shoulder/ R iliac crest lowered,   hypomobility at C/T junction with small dowagers  Goal status: MET    5. Pt will report no LBP with rotation/ sideflexion and regain sideflexion bilaterally in order to perform ADLs  Baseline: L sideflexion / Rotation causing pinching at L PSIS  localized pain  4/10 rotation, 3/10 pain with sideflexion  ,  L c 50cm  digit III to the floor at the onset of pain,   R 44 cm  Goal status: INITIAL  6. Pt will report no longer needing to return to pee a second time and able to empty urine completely 100% of the time to minimize risks for UTI and return to work tasks without interruptions Baseline:pt has to return to pee again after the first time. This occurs 50% of the time   Goal status: INITIAL   7. 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 Pt reports type 4 consistency > 50% of the time per day instead of once a week,  Type 6-7 across 50% of the time  Baseline:ype 4 occurring once a week,  Type 6-7 occurs every time,  BMs occur 3 x a day . Pt has struggled with BMs her whole life.  Pt used to go between constipation and diarrhea a lot.   Goal status: INITIAL  9. Pt will demo increased endurance > 30sec for cervical endurance mm in order to to return to chataraunga and other yoga practices  Baseline:cervical endurance test : 18.26 sec at point of R shoulder tightness , poor cervicoscapular retraction, overuse of upper trap  Goal status: INITIAL   Pia Lupe Plump, PT 07/04/2024, 4:37 PM

## 2024-07-10 ENCOUNTER — Ambulatory Visit: Admitting: Physical Therapy

## 2024-07-11 ENCOUNTER — Ambulatory Visit: Admitting: Physical Therapy

## 2024-07-11 DIAGNOSIS — M533 Sacrococcygeal disorders, not elsewhere classified: Secondary | ICD-10-CM

## 2024-07-11 DIAGNOSIS — M5459 Other low back pain: Secondary | ICD-10-CM | POA: Diagnosis not present

## 2024-07-11 DIAGNOSIS — R2689 Other abnormalities of gait and mobility: Secondary | ICD-10-CM

## 2024-07-11 NOTE — Therapy (Unsigned)
 OUTPATIENT PHYSICAL THERAPY TREATMENT    Patient Name: April Hoffman MRN: [id number] DOB:09-26-1981, 43 y.o., female Today's Date: 07/11/2024   PT End of Session - 07/11/24 1638     Visit Number 9    Number of Visits 19    Date for PT Re-Evaluation 09/19/24    PT Start Time 1633    PT Stop Time 1715    PT Time Calculation (min) 42 min    Activity Tolerance Patient tolerated treatment well;No increased pain    Behavior During Therapy [facility] for tasks assessed/performed           No past medical history on file. Past Surgical History:  Procedure Laterality Date   GUM SURGERY     WISDOM TOOTH EXTRACTION     Patient Active Problem List   Diagnosis Date Noted   B12 deficiency 05/25/2021   GAD (generalized anxiety disorder) 05/25/2021   Major depressive disorder, recurrent episode, moderate with mixed features (HCC) 05/25/2021   Panic attacks 05/25/2021   Gastroesophageal reflux disease without esophagitis 02/03/2021   Chest pain at rest 02/03/2021   Dysphagia 02/03/2021   Anxiety 02/03/2021   Current mild episode of major depressive disorder without prior episode (HCC) 08/22/2019    PCP: Efraim   REFERRING PROVIDER: Tanda MART DIAG:  M62.89 (ICD-10-CM) - Pelvic floor tension  N94.10 (ICD-10-CM) - Dyspareunia in female    Rationale for Evaluation and Treatment   THERehabilitationRAPY DIAG:  Other low back pain  Sacrococcygeal disorders, not elsewhere classified  Other abnormalities of gait and mobility  ONSET DATE:   SUBJECTIVE:           SUBJECTIVE STATEMENT TODAY:   Pt went to a more difficult yoga class and only felt sore, no LBP nor sciatica.        SUBJECTIVE STATEMENT ON EVAL 04/25/24 :  1 ) B LBP  : at its worst 7/10 after sitting 2 hours, Pain now only is located across the back and hips,  When it radiates to the top upper posterior thigh occurs when she has been sitting   Teaches yoga 1 class a week, take classes 2-3  x week,  one pilates class   2) Frequent urination  - within 2 hours, 5  x .  Pt read an article that indicated  Power peeing with using her abdominal muscles to pee and the need to stop doing that. Pt realized she had been doing that for a long time.  Pt reports she finds it difficult to relax her pelvic floor to pee.       3)  SUI :  with sneezing . New in the last 2 years.  Pt does not  wear pads.    4)  incomplete emptying urine : pt has to return to pee again after the first time. This occurs 50% of the time    5)  Loose stools,   type 4 occurring once a week,  Type 6-7 occurs every time,  BMs occur 3 x a day . Pt has struggled with BMs her whole life.  Pt used to go between constipation and diarrhea a lot.    Daily fluid : water not enough 42 fl oz .  32 fl oz coffee, no juice, sodas ,   6) pain with sexual intercourse  -Did pelvic floor therapy 10 years ago with 3 months of session, therapist mostly focused on manual Tx internally and use of dilators. Pt did not enjoy using the dilators.  PERTINENT HISTORY:  R sciatic leg pain and loss of sensation lasted Feb-April 2024 , Chiropractic T x helped to regain sensation. Pain now only is located across the back and hips,  When it radiates to the top upper posterior thigh occurs when she has been sitting for 9 hrs/ day at a conference.    PAIN:  Are you having pain? Yes: see above   PRECAUTIONS: None  WEIGHT BEARING RESTRICTIONS: No  FALLS:  Has patient fallen in last 6 months?  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   PLOF: pain with sexual intercourse worsened as she got older    PATIENT GOALS:     OBJECTIVE:          See pt instruction section    ASSESSMENT:  CLINICAL IMPRESSION:  Pt has met 3 /10 goals and progressing well towards remaining  goals:  Improvements across past 9 visits include:  Levelled shoulders and pelvic girdle and improved sideflexion without pinching LBP  Increased propioception in standing posture with less hyperextended knees, more co-activation of transverse arches, and decreased upper trap overuse /chest breathing and more diaphragmatic breathing to optimize IAP and relaxation / less body tensions Improved reciporcal gait and increased gait speed  Pt was able to lift and move boxes during her move without the weakness and debilitating radiating LBP that she had with the first episode of LBP Feb 2024 and second relapse in June 2024.  Urinary improvements: Frequency decreased from 5 x every 2 hours  to 3 x every 2 hours.  Leakage with sneezing is improving  B LBP improvements: Pt is able to sit for 2 hours with decreased pain level from 7/10 to 3/10 level pain .  No relapse of sciatic pain since starting pelvic PT  Pt went to a more difficult yoga class and only felt sore, no LBP nor sciatica.   Pt demo'd increased sideflexion ROM without LBP             Pelvic pain    10-15% less pain with sexual intercourse    Upcoming sessions will address difficulty with emptying urine, stool consistency, decrease bowel frequency, and further decreasing pelvic pain during sexual intercourse      Progressed to deep core level  2 today and and multifidis strengthening  next session.    Plan to add propicoeption and training of upper quadrant in yoga and lifting practices to minimize guarding behavorial which is likely contributing to pt's deviations and tightness to L side of thoracic area.   Regional interdependent approaches will yield greater benefits in pt's POC.                   These improvements made today will help promote optimize IAP system for improved pelvic floor function, trunk stability, gait, balance, stabilization with mobility tasks.                 Plan to address pelvic floor issues once  pelvis and spine are realigned to yield better outcomes.  Pt benefits from skilled PT.    OBJECTIVE IMPAIRMENTS decreased activity tolerance, decreased coordination, decreased endurance, decreased mobility, difficulty walking, decreased ROM, decreased strength, decreased safety awareness, hypomobility, increased muscle spasms, impaired flexibility, improper body mechanics, postural dysfunction, and pain.   ACTIVITY LIMITATIONS  self-care,  driving and sitting    PARTICIPATION LIMITATIONS:  community  activities    PERSONAL FACTORS   affecting patient's functional outcome:    REHAB POTENTIAL: Good   CLINICAL DECISION MAKING: Evolving/moderate complexity   EVALUATION COMPLEXITY: Moderate    PATIENT EDUCATION:    Education details: Showed pt anatomy images. Explained muscles attachments/ connection, physiology of deep core system/ spinal- thoracic-pelvis-lower kinetic chain as they relate to pt's presentation, Sx, and past Hx. Explained what and how these areas of deficits need to be restored to balance and function    See Therapeutic activity / neuromuscular re-education section  Answered pt's questions.   Person educated: Patient Education method: Explanation, Demonstration, Tactile cues, Verbal cues, and Handouts Education comprehension: verbalized understanding, returned demonstration, verbal cues required, tactile cues required, and needs further education     PLAN: PT FREQUENCY: 1x/week   PT DURATION: 10 weeks   PLANNED INTERVENTIONS:   Gait training;Stair training;Functional mobility training;DME Instruction;Therapeutic activities;Therapeutic exercise;Balance training;Neuromuscular re-education;Patient/family education;Vestibular;Visual/perceptual remediation/compensation;Passive range of motion;Moist Heat;Cryotherapy;Traction;Canalith Repostioning;Joint Manipulations;Manual lymph drainage;Manual techniques;Scar  mobilization;Energy conservation;Dry needling;ADLs/Self Care Home Management;Biofeedback;Electrical Stimulation;Taping    PLAN FOR NEXT SESSION: See clinical impression for plan     GOALS: Goals reviewed with patient? Yes  SHORT TERM GOALS: Target date: 05/23/2024    Pt will demo IND with HEP                    Baseline: Not IND            Goal status: INITIAL   LONG TERM GOALS: Target date: 09/19/2024      1.Pt will demo proper deep core coordination without chest breathing and optimal excursion of diaphragm/pelvic floor in order to promote spinal stability and pelvic floor function to minimize SUI and dyspareunia  Baseline: dyscoordination Goal status: Ongoing   2.  Pt will demo proper body mechanics in against gravity tasks and ADLs  work tasks, fitness ( modified yoga and pilate practice)   to minimize straining pelvic floor / back    Baseline: not IND, improper form that places strain on pelvic floor  Goal status: Ongoing    3. Pt will demo increased gait speed > 1.6  m/s with reciprocal gait pattern, longer stride length  in order to ambulate safely in community and return to fitness routine  Baseline: 1.44 m/s, decreased stance on R, R trunk lean,  limited thoracic rotation posterior,  R anterior pelvic rotation  Goal status:  MET  ( 1.6 m/s reciprocal gait, armswings)    4. Pt will demo levelled pelvic girdle and shoulder height in order to progress to deep core strengthening HEP and restore mobility at spine, pelvis, gait, posture minimize falls, and improve balance  Baseline: R shoulder/ R iliac crest lowered,   hypomobility at C/T junction with small dowagers  Goal status: MET    5. Pt will report no LBP with rotation/ sideflexion and regain sideflexion bilaterally in order to perform ADLs  Baseline: L sideflexion / Rotation causing pinching at L PSIS  localized pain  4/10 rotation, 3/10 pain with sideflexion  ,  L c 50cm  digit III to the floor at the onset of  pain,   R 44  cm   07/11/24: sideflexion   46 cm on L without LBP , R 43 cm,   rotation: minor pinching without radiating pain  Goal status:  Ongoing   6. Pt will report no longer needing to return to pee a second time and able to empty urine completely 100% of the time to minimize risks for UTI and return to work tasks without interruptions Baseline:pt has to return to pee again after the first time. This occurs 50% of the time   Goal status:  MET    7. 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 Pt reports type 4 consistency > 50% of the time per day instead of once a week,  Type 6-7 across 50% of the time  Baseline:ype 4 occurring once a week,  Type 6-7 occurs every time,  BMs occur 3 x a day . Pt has struggled with BMs her whole life.  Pt used to go between constipation and diarrhea a lot.   Goal status: Ongoing   9. Pt will demo increased endurance > 30sec for cervical endurance mm in order to to return to [place] and other yoga practices  Baseline:cervical endurance test : 18.26 sec at point of R shoulder tightness , poor cervicoscapular retraction, overuse of upper trap  Goal status:  Ongoing   10. Pt will report no longer need to return to chiropractic treatments across 3 weeks and to be able to perform yoga poses with backbends with modifications  Goal status:  NEW     Pia Lupe Plump, PT 07/11/2024, 4:39 PM

## 2024-07-11 NOTE — Patient Instructions (Signed)
 Multifidis twist   Band is on doorknob: sit facing perpendicular to door , sit halfway towards front of chair, firm through 4 points of contact at buttocks and feet. Feet are placed hip with apart.   Twisting trunk without moving the hips and knees Hold band at the level of ribcage, elbows bent,shoulder blades roll back and down like squeezing a pencil under armpit   Exhale twist,.10-15 deg away from door without moving your hips/ knees, press more weight on the side of the sitting bones/ foot opp of your direction of turn as your counterweight. Continue to maintain equal weight through legs.  Keep knee unlocked.  30 reps   __  Deep core level 1-2 ( handout)  2 x day

## 2024-07-17 ENCOUNTER — Ambulatory Visit: Admitting: Physical Therapy

## 2024-07-18 ENCOUNTER — Ambulatory Visit: Admitting: Physical Therapy

## 2024-07-18 DIAGNOSIS — M533 Sacrococcygeal disorders, not elsewhere classified: Secondary | ICD-10-CM

## 2024-07-18 DIAGNOSIS — M5459 Other low back pain: Secondary | ICD-10-CM | POA: Diagnosis not present

## 2024-07-18 DIAGNOSIS — R2689 Other abnormalities of gait and mobility: Secondary | ICD-10-CM

## 2024-07-18 NOTE — Therapy (Addendum)
 OUTPATIENT PHYSICAL THERAPY TREATMENT  / Progress note across 10 visits from 04/25/24 : to 07/18/24   Patient Name: April Hoffman MRN: [id number] DOB:October 14, 1981, 43 y.o., female Today's Date: 07/18/2024   PT End of Session - 07/18/24 1633     Visit Number 10    Number of Visits 19    Date for Recertification  09/19/24    PT Start Time 1630    PT Stop Time 1715    PT Time Calculation (min) 45 min    Activity Tolerance Patient tolerated treatment well;No increased pain    Behavior During Therapy [facility] for tasks assessed/performed           No past medical history on file. Past Surgical History:  Procedure Laterality Date   GUM SURGERY     WISDOM TOOTH EXTRACTION     Patient Active Problem List   Diagnosis Date Noted   B12 deficiency 05/25/2021   GAD (generalized anxiety disorder) 05/25/2021   Major depressive disorder, recurrent episode, moderate with mixed features (HCC) 05/25/2021   Panic attacks 05/25/2021   Gastroesophageal reflux disease without esophagitis 02/03/2021   Chest pain at rest 02/03/2021   Dysphagia 02/03/2021   Anxiety 02/03/2021   Current mild episode of major depressive disorder without prior episode 08/22/2019    PCP: Efraim   REFERRING PROVIDER: Tanda MART DIAG:  M62.89 (ICD-10-CM) - Pelvic floor tension  N94.10 (ICD-10-CM) - Dyspareunia in female    Rationale for Evaluation and Treatment   THERehabilitationRAPY DIAG:  Other low back pain  Sacrococcygeal disorders, not elsewhere classified  Other abnormalities of gait and mobility  ONSET DATE:   SUBJECTIVE:           SUBJECTIVE STATEMENT TODAY:   Pt noticed the next morning after last session ( after performing 5 reps of multidifis)  she noticed mm spams along posterior  L low rib area while carrying bookpack on her back ( 15-20 lbs) . It occurred several times during th at day and she had to take a muscle relaxant and lie on a heating pad.   The following day, she had  no mm spasms with wearing bookbag. Pt returned to multidifis exercise and ramp upt to15 reps and now 30 reps without reoccurrence of mm spasms    L hip socket has a nerve pain was similar to the pain that she had on the R hip but not to the same extent as the original debilitating pain.  L glut pain  happens frequently and worst this week.  It feels angry.  Pt saw chiropractor yesterday and both pains decreased by 15%.  These pain occurred at the same time as the posterior L low rib pain .   Pt feels the flexion table helped decrease her pain.    Pt feels these mm spasms and L hip/ glut pain are part of her healing process I trust in the process   Right now, pt is not feeling the nn pain. L glut pain right now is 2-3/10 pain level.  When shifting hip to the L side, there is a pain at the iliac crest.    SUBJECTIVE STATEMENT ON EVAL 04/25/24 :  1 ) B LBP  : at its worst 7/10 after sitting 2 hours, Pain now only is located across the back and hips,  When it radiates to the top upper posterior thigh occurs when she has been sitting   Teaches yoga 1 class a week, take classes 2-3  x week,  one pilates class   2) Frequent urination  - within 2 hours, 5  x .  Pt read an article that indicated  Power peeing with using her abdominal muscles to pee and the need to stop doing that. Pt realized she had been doing that for a long time.  Pt reports she finds it difficult to relax her pelvic floor to pee.       3)  SUI :  with sneezing . New in the last 2 years.  Pt does not  wear pads.    4)  incomplete emptying urine : pt has to return to pee again after the first time. This occurs 50% of the time    5)  Loose stools,   type 4 occurring once a week,  Type 6-7 occurs every time,  BMs occur 3 x a day . Pt has struggled with BMs her whole life.  Pt used to go between constipation and diarrhea a lot.    Daily fluid : water not enough 42 fl oz .  32 fl oz coffee, no juice, sodas ,   6) pain with  sexual intercourse  -Did pelvic floor therapy 10 years ago with 3 months of session, therapist mostly focused on manual Tx internally and use of dilators. Pt did not enjoy using the dilators.    PERTINENT HISTORY:  R sciatic leg pain and loss of sensation lasted Feb-April 2024 , Chiropractic T x helped to regain sensation. Pain now only is located across the back and hips,  When it radiates to the top upper posterior thigh occurs when she has been sitting for 9 hrs/ day at a conference.    PAIN:  Are you having pain? Yes: see above   PRECAUTIONS: None  WEIGHT BEARING RESTRICTIONS: No  FALLS:  Has patient fallen in last 6 months?  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   PLOF: pain with sexual intercourse worsened as she got older    PATIENT GOALS:     OBJECTIVE:     [facility] PT Assessment - 07/18/24 1654       AROM   Overall AROM Comments L hip ext more limited than R      Palpation   SI assessment  hypomobile L SIJ ,  B sacrococcygeus area    Palpation comment T 7-10 hypomobile limited segmental mobility,  L 1 hypomobile L,          [facility] Adult PT Treatment/Exercise - 07/18/24 1711       Self-Care   Other Self-Care Comments  explained pelvic girdle stability and LKC propioception to help with minimize pain, provided biopsychosocial approaches to continue with pain science education      Neuro Re-ed    Neuro Re-ed Details  excessive cues for propicoeption with stepping back lounge with wider BOS to minimize pelvic shifts , cued for pelvic propcioeption with spine without thoracic kyphosis  in pelvic stretch to minimzie popping  and to stretch posterior pelvic floor and hip abduction mm mm and mobility SIJ      Manual Therapy   Manual therapy comments PA mob / medial glide at problem areas noted in  assessment to promote posteirior rotation of L intercostals, minimize tightness along paraspinals to sacrum, STM/MWM at SIJ to promote mobility in ER/abd  See pt instruction section    ASSESSMENT:  CLINICAL IMPRESSION:  Pt has met 3 /10 goals and progressing well towards remaining goals:  Improvements across past 9 visits include:  Levelled shoulders and pelvic girdle and improved sideflexion without pinching LBP  Increased propioception in standing posture with less hyperextended knees, more co-activation of transverse arches, and decreased upper trap overuse /chest breathing and more diaphragmatic breathing to optimize IAP and relaxation / less body tensions Improved reciporcal gait and increased gait speed  Pt was able to lift and move boxes during her move without the weakness and debilitating radiating LBP that she had with the first episode of LBP Feb 2024 and second relapse in June 2024.  Urinary improvements: Frequency decreased from 5 x every 2 hours  to 3 x every 2 hours.  Leakage with sneezing is improving  B LBP improvements: Pt is able to sit for 2 hours with decreased pain level from 7/10 to 3/10 level pain .  No relapse of sciatic pain since starting pelvic PT  Pt went to a more difficult yoga class and only felt sore, no LBP nor sciatica.   Pt demo'd increased sideflexion ROM without LBP             Pelvic pain    10-15% less pain with sexual intercourse    Upcoming sessions will address difficulty with emptying urine, stool consistency, decrease bowel frequency, and further decreasing pelvic pain during sexual intercourse     Today, applied manual Tx to L thoracic spine , B SIJ to promote mobility, Required cues for pelvic and LKC propioception in backward stepping and pelvic stretches. Pt demo'd correctly HEP   Pt reported no pain post Tx.  Pt had no popping at SIJ with cues for correct form for pelvic floor stretches.   Plan to add  propicoeption and training of upper quadrant in yoga and lifting practices to minimize guarding behavorial which is likely contributing to pt's deviations and tightness to L side of thoracic area.   Regional interdependent approaches will yield greater benefits in pt's POC.                   These improvements made today will help promote optimize IAP system for improved pelvic floor function, trunk stability, gait, balance, stabilization with mobility tasks.                 Plan to address pelvic floor issues once pelvis and spine are realigned to yield better outcomes.                                                      Pt benefits from skilled PT.    OBJECTIVE IMPAIRMENTS decreased activity tolerance, decreased coordination, decreased endurance, decreased mobility, difficulty walking, decreased ROM, decreased strength, decreased safety awareness, hypomobility, increased muscle spasms, impaired flexibility, improper body mechanics, postural dysfunction, and pain.   ACTIVITY LIMITATIONS  self-care,  driving and sitting    PARTICIPATION LIMITATIONS:  community  activities    PERSONAL FACTORS   affecting patient's functional outcome:    REHAB POTENTIAL: Good   CLINICAL DECISION MAKING: Evolving/moderate complexity   EVALUATION COMPLEXITY: Moderate    PATIENT EDUCATION:    Education details: Showed pt anatomy images. Explained muscles attachments/ connection, physiology of deep core system/ spinal- thoracic-pelvis-lower kinetic chain  as they relate to pt's presentation, Sx, and past Hx. Explained what and how these areas of deficits need to be restored to balance and function    See Therapeutic activity / neuromuscular re-education section  Answered pt's questions.   Person educated: Patient Education method: Explanation, Demonstration, Tactile cues, Verbal cues, and Handouts Education comprehension: verbalized understanding, returned demonstration, verbal cues required, tactile  cues required, and needs further education     PLAN: PT FREQUENCY: 1x/week   PT DURATION: 10 weeks   PLANNED INTERVENTIONS:   Gait training;Stair training;Functional mobility training;DME Instruction;Therapeutic activities;Therapeutic exercise;Balance training;Neuromuscular re-education;Patient/family education;Vestibular;Visual/perceptual remediation/compensation;Passive range of motion;Moist Heat;Cryotherapy;Traction;Canalith Repostioning;Joint Manipulations;Manual lymph drainage;Manual techniques;Scar mobilization;Energy conservation;Dry needling;ADLs/Self Care Home Management;Biofeedback;Electrical Stimulation;Taping    PLAN FOR NEXT SESSION: See clinical impression for plan     GOALS: Goals reviewed with patient? Yes  SHORT TERM GOALS: Target date: 05/23/2024    Pt will demo IND with HEP                    Baseline: Not IND            Goal status: INITIAL   LONG TERM GOALS: Target date: 09/19/2024      1.Pt will demo proper deep core coordination without chest breathing and optimal excursion of diaphragm/pelvic floor in order to promote spinal stability and pelvic floor function to minimize SUI and dyspareunia  Baseline: dyscoordination Goal status: Ongoing   2.  Pt will demo proper body mechanics in against gravity tasks and ADLs  work tasks, fitness ( modified yoga and pilate practice)   to minimize straining pelvic floor / back    Baseline: not IND, improper form that places strain on pelvic floor  Goal status: Ongoing    3. Pt will demo increased gait speed > 1.6  m/s with reciprocal gait pattern, longer stride length  in order to ambulate safely in community and return to fitness routine  Baseline: 1.44 m/s, decreased stance on R, R trunk lean,  limited thoracic rotation posterior,  R anterior pelvic rotation  Goal status:  MET  ( 1.6 m/s reciprocal gait, armswings)    4. Pt will demo levelled pelvic girdle and shoulder height in order to progress to deep core  strengthening HEP and restore mobility at spine, pelvis, gait, posture minimize falls, and improve balance  Baseline: R shoulder/ R iliac crest lowered,   hypomobility at C/T junction with small dowagers  Goal status: MET    5. Pt will report no LBP with rotation/ sideflexion and regain sideflexion bilaterally in order to perform ADLs  Baseline: L sideflexion / Rotation causing pinching at L PSIS  localized pain  4/10 rotation, 3/10 pain with sideflexion  ,  L c 50cm  digit III to the floor at the onset of pain,   R 44 cm   07/11/24: sideflexion   46 cm on L without LBP , R 43 cm,   rotation: minor pinching without radiating pain  Goal status:  Ongoing   6. Pt will report no longer needing to return to pee a second time and able to empty urine completely 100% of the time to minimize risks for UTI and return to work tasks without interruptions Baseline:pt has to return to pee again after the first time. This occurs 50% of the time   Goal status:  MET    7. 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 Pt reports type 4 consistency > 50% of the time per day instead of once a week,  Type 6-7 across 50% of the time  Baseline:ype 4 occurring once a week,  Type 6-7 occurs every time,  BMs occur 3 x a day . Pt has struggled with BMs her whole life.  Pt used to go between constipation and diarrhea a lot.   Goal status: Ongoing   9. Pt will demo increased endurance > 30sec for cervical endurance mm in order to to return to [place] and other yoga practices  Baseline:cervical endurance test : 18.26 sec at point of R shoulder tightness , poor cervicoscapular retraction, overuse of upper trap  Goal status:  Ongoing   10. Pt will report no longer need to return to chiropractic treatments across 3 weeks and to be able  to perform yoga poses with backbends with modifications  Goal status:  NEW     Pia Lupe Plump, PT 07/18/2024, 4:34 PM

## 2024-07-18 NOTE — Patient Instructions (Addendum)
 Stretch for pelvic floor     On belly: Riding horse edge of mattress  knee bent like riding a horse, move knee towards armpit and out  10 reps   Mermaid stretch  Rocking while seated on the floor with heels to one side of the hip Heels to one side of the hip  Rock forward towards the knee that is bent , rock beck towards the opposite sitting bones   To minimize not popping the hips: Do not create movement in the midback NO cat cow Only whopelvis and spine tipping, anterior tilt of pelvis the entire time

## 2024-07-25 ENCOUNTER — Ambulatory Visit: Attending: Certified Nurse Midwife | Admitting: Physical Therapy

## 2024-07-25 DIAGNOSIS — R2689 Other abnormalities of gait and mobility: Secondary | ICD-10-CM | POA: Diagnosis present

## 2024-07-25 DIAGNOSIS — M5459 Other low back pain: Secondary | ICD-10-CM | POA: Diagnosis present

## 2024-07-25 DIAGNOSIS — M533 Sacrococcygeal disorders, not elsewhere classified: Secondary | ICD-10-CM | POA: Diagnosis present

## 2024-07-25 NOTE — Therapy (Signed)
 OUTPATIENT PHYSICAL THERAPY TREATMENT    Patient Name: April Hoffman MRN: [id number] DOB:09-Feb-1981, 43 y.o., female Today's Date: 07/25/2024   PT End of Session - 07/25/24 1637     Visit Number 11    Number of Visits 19    Date for Recertification  09/19/24    PT Start Time 1640    PT Stop Time 1720    PT Time Calculation (min) 40 min    Activity Tolerance Patient tolerated treatment well;No increased pain    Behavior During Therapy [facility] for tasks assessed/performed           No past medical history on file. Past Surgical History:  Procedure Laterality Date   GUM SURGERY     WISDOM TOOTH EXTRACTION     Patient Active Problem List   Diagnosis Date Noted   B12 deficiency 05/25/2021   GAD (generalized anxiety disorder) 05/25/2021   Major depressive disorder, recurrent episode, moderate with mixed features (HCC) 05/25/2021   Panic attacks 05/25/2021   Gastroesophageal reflux disease without esophagitis 02/03/2021   Chest pain at rest 02/03/2021   Dysphagia 02/03/2021   Anxiety 02/03/2021   Current mild episode of major depressive disorder without prior episode 08/22/2019    PCP: Efraim   REFERRING PROVIDER: Tanda MART DIAG:  M62.89 (ICD-10-CM) - Pelvic floor tension  N94.10 (ICD-10-CM) - Dyspareunia in female    Rationale for Evaluation and Treatment   THERehabilitationRAPY DIAG:  Other low back pain  Sacrococcygeal disorders, not elsewhere classified  Other abnormalities of gait and mobility  ONSET DATE:   SUBJECTIVE:           SUBJECTIVE STATEMENT TODAY:   Pt noticed pt felt sore for a few days.  Pt noticed more flexibility at L shoulder.  Still sore and achey at the L iliac crest  Pt did a yoga class last week and had no issues.  Still avoiding Chatarunga and back bends    SUBJECTIVE STATEMENT ON EVAL 04/25/24 :  1 ) B LBP  : at its worst 7/10 after sitting 2 hours, Pain now only is located across the back and hips,  When it  radiates to the top upper posterior thigh occurs when she has been sitting   Teaches yoga 1 class a week, 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  Pt read an article that indicated  Power peeing with using her abdominal muscles to pee and the need to stop doing that. Pt realized she had been doing that for a long time.  Pt reports she finds it difficult to relax her pelvic floor to pee.       3)  SUI :  with sneezing . New in the last 2 years.  Pt does not  wear pads.    4)  incomplete emptying urine : pt has to return to pee again after the first time. This occurs 50% of the time    5)  Loose stools,   type 4 occurring once a week,  Type 6-7 occurs every time,  BMs occur 3 x a day . Pt has struggled with BMs her whole life.  Pt used to go between constipation and diarrhea a lot.    Daily fluid : water not enough 42 fl oz .  32 fl oz coffee, no juice, sodas ,   6) pain with sexual intercourse  -Did pelvic floor therapy 10  years ago with 3 months of session, therapist mostly focused on manual Tx internally and use of dilators. Pt did not enjoy using the dilators.    PERTINENT HISTORY:  R sciatic leg pain and loss of sensation lasted Feb-April 2024 , Chiropractic T x helped to regain sensation. Pain now only is located across the back and hips,  When it radiates to the top upper posterior thigh occurs when she has been sitting for 9 hrs/ day at a conference.    PAIN:  Are you having pain? Yes: see above   PRECAUTIONS: None  WEIGHT BEARING RESTRICTIONS: No  FALLS:  Has patient fallen in last 6 months?  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   PLOF: pain with sexual intercourse worsened as she got older    PATIENT GOALS:     OBJECTIVE:      [facility] PT  Assessment - 07/25/24 1646       Observation/Other Assessments   Observations L collapsed arches > R, esp when L back foot in  warrior I      Strength   Overall Strength Comments single UE support,  PF  12 reps on R with hamstring tiring she reports, L 9 reps with glut weakness ( post Tx:  12 reps on L)      Palpation   Spinal mobility levelled pelvis and shoulders  less upper trap overuse    Palpation comment hypomobile T10 deviated L ,  hypoomobile midfoot archs/ tib-fib  L> R,  ,          [facility] Adult PT Treatment/Exercise - 07/25/24 1734       Self-Care   Other Self-Care Comments  explained pelvic girdle stability and role of LKC and use of transverse arch  to minimize L glut achiness      Neuro Re-ed    Neuro Re-ed Details  cued for [facility] propioception with new HEP and stnading posture to promote hip abd/ER and lifted arches / less hyperextended knees, and more transverse arch of feet in gait , longer stride and higher knees in gait      Manual Therapy   Manual therapy comments distraction at L LE, PA/AP mob at midfoot joints, STM/ MWM along midfoot and tib/fib fascial line to promtoe hip abd/ER, knee ER/abd, feet mobility, toe abduction LLE              See pt instruction section    ASSESSMENT:  CLINICAL IMPRESSION:  Pt has met 3 /10 goals and progressing well towards remaining goals:  Improvements across past 9 visits include:  Levelled shoulders and pelvic girdle and improved sideflexion without pinching LBP  Increased propioception in standing posture with less hyperextended knees, more co-activation of transverse arches, and decreased upper trap overuse /chest breathing and more diaphragmatic breathing to optimize IAP and relaxation / less body tensions Improved reciporcal gait and increased gait speed  Pt was able to lift and move boxes during her move without the weakness and debilitating radiating LBP that she had with the first episode of LBP Feb 2024 and  second relapse in June 2024.  Urinary improvements: Frequency decreased from 5 x every 2 hours  to 3 x every 2 hours.  Leakage with sneezing is improving  B LBP improvements: Pt is able to sit  for 2 hours with decreased pain level from 7/10 to 3/10 level pain .  No relapse of sciatic pain since starting pelvic PT  Pt went to a more difficult yoga class and only felt sore, no LBP nor sciatica.   Pt demo'd increased sideflexion ROM without LBP             Pelvic pain    10-15% less pain with sexual intercourse    Upcoming sessions will address difficulty with emptying urine, stool consistency, decrease bowel frequency, and further decreasing pelvic pain during sexual intercourse     Today, applied manual Tx to L SIJ and LKC  to promote more mobility in these areas. Cued for [facility] propioception with new HEP and standing posture to promote hip abd/ER and lifted arches / less hyperextended knees, and more transverse arch of feet in gait , longer stride and higher knees in gait Explained pelvic girdle stability and role of LKC and use of transverse arch  to minimize L glut achiness. Pt showed increased plantar fascia mobility and strength on LLE post Tx. Continue address pt's collapsed arches/ feet hypomobility and help with LKC deficits to help address pelvic related goals and help pt return to yoga poses she once avoided due to LBP pain    Regional interdependent approaches will yield greater benefits in pt's POC.                   These improvements made today will help promote optimize IAP system for improved pelvic floor function, trunk stability, gait, balance, stabilization with mobility tasks.                 Plan to address pelvic floor issues once pelvis and spine are realigned to yield better outcomes.                                                      Pt benefits from skilled PT.    OBJECTIVE IMPAIRMENTS decreased activity tolerance, decreased coordination, decreased  endurance, decreased mobility, difficulty walking, decreased ROM, decreased strength, decreased safety awareness, hypomobility, increased muscle spasms, impaired flexibility, improper body mechanics, postural dysfunction, and pain.   ACTIVITY LIMITATIONS  self-care,  driving and sitting    PARTICIPATION LIMITATIONS:  community  activities    PERSONAL FACTORS   affecting patient's functional outcome:    REHAB POTENTIAL: Good   CLINICAL DECISION MAKING: Evolving/moderate complexity   EVALUATION COMPLEXITY: Moderate    PATIENT EDUCATION:    Education details: Showed pt anatomy images. Explained muscles attachments/ connection, physiology of deep core system/ spinal- thoracic-pelvis-lower kinetic chain as they relate to pt's presentation, Sx, and past Hx. Explained what and how these areas of deficits need to be restored to balance and function    See Therapeutic activity / neuromuscular re-education section  Answered pt's questions.   Person educated: Patient Education method: Explanation, Demonstration, Tactile cues, Verbal cues, and Handouts Education comprehension: verbalized understanding, returned demonstration, verbal cues required, tactile cues required, and needs further education     PLAN: PT FREQUENCY: 1x/week   PT DURATION: 10 weeks   PLANNED INTERVENTIONS:   Gait training;Stair training;Functional mobility training;DME Instruction;Therapeutic activities;Therapeutic exercise;Balance training;Neuromuscular re-education;Patient/family education;Vestibular;Visual/perceptual remediation/compensation;Passive range of motion;Moist Heat;Cryotherapy;Traction;Canalith Repostioning;Joint Manipulations;Manual lymph drainage;Manual techniques;Scar mobilization;Energy conservation;Dry needling;ADLs/Self Care Home Management;Biofeedback;Electrical Stimulation;Taping    PLAN FOR NEXT SESSION:  See clinical impression for plan     GOALS: Goals reviewed with patient? Yes  SHORT TERM  GOALS: Target date: 05/23/2024    Pt will demo IND with HEP                    Baseline: Not IND            Goal status: INITIAL   LONG TERM GOALS: Target date: 09/19/2024      1.Pt will demo proper deep core coordination without chest breathing and optimal excursion of diaphragm/pelvic floor in order to promote spinal stability and pelvic floor function to minimize SUI and dyspareunia  Baseline: dyscoordination Goal status: Ongoing   2.  Pt will demo proper body mechanics in against gravity tasks and ADLs  work tasks, fitness ( modified yoga and pilate practice)   to minimize straining pelvic floor / back    Baseline: not IND, improper form that places strain on pelvic floor  Goal status: Ongoing    3. Pt will demo increased gait speed > 1.6  m/s with reciprocal gait pattern, longer stride length  in order to ambulate safely in community and return to fitness routine  Baseline: 1.44 m/s, decreased stance on R, R trunk lean,  limited thoracic rotation posterior,  R anterior pelvic rotation  Goal status:  MET  ( 1.6 m/s reciprocal gait, armswings)    4. Pt will demo levelled pelvic girdle and shoulder height in order to progress to deep core strengthening HEP and restore mobility at spine, pelvis, gait, posture minimize falls, and improve balance  Baseline: R shoulder/ R iliac crest lowered,   hypomobility at C/T junction with small dowagers  Goal status: MET    5. Pt will report no LBP with rotation/ sideflexion and regain sideflexion bilaterally in order to perform ADLs  Baseline: L sideflexion / Rotation causing pinching at L PSIS  localized pain  4/10 rotation, 3/10 pain with sideflexion  ,  L c 50cm  digit III to the floor at the onset of pain,   R 44 cm   07/11/24: sideflexion   46 cm on L without LBP , R 43 cm,   rotation: minor pinching without radiating pain  Goal status:  Ongoing   6. Pt will report no longer needing to return to pee a second time and able to empty  urine completely 100% of the time to minimize risks for UTI and return to work tasks without interruptions Baseline:pt has to return to pee again after the first time. This occurs 50% of the time   Goal status:  MET    7. 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 Pt reports type 4 consistency > 50% of the time per day instead of once a week,  Type 6-7 across 50% of the time  Baseline:ype 4 occurring once a week,  Type 6-7 occurs every time,  BMs occur 3 x a day . Pt has struggled with BMs her whole life.  Pt used to go between constipation and diarrhea a lot.   Goal status: Ongoing   9. Pt will demo increased endurance > 30sec for cervical endurance mm in order to to return to chataraunga and other yoga practices  Baseline:cervical endurance test :  18.26 sec at point of R shoulder tightness , poor cervicoscapular retraction, overuse of upper trap  Goal status:  Ongoing   10. Pt will report no longer need to return to chiropractic treatments across 3 weeks and to be able to perform yoga poses with backbends with modifications  Goal status:  NEW     Pia Lupe Plump, PT 07/25/2024, 4:44 PM

## 2024-07-25 NOTE — Patient Instructions (Addendum)
 Strengthening feet arches:      Feet slides :   Points of contact at sitting bones  Four points of contact of foot,  Side knee back while keeping knee out along 2-3rd toe line   Heel up, ankle not twist out Lower heel while keeping knee out along 2-3rd toe line Four points of contact of foot, Slide foot back while keeping knee out along 2-3rd toe line   Repeated with other foot   3 min  __  Standing across ballmounds and heels,  unlocked knees   Walking with higher knees, paw the earth , weight across ballmounds

## 2024-08-02 ENCOUNTER — Ambulatory Visit: Admitting: Physical Therapy

## 2024-08-09 ENCOUNTER — Ambulatory Visit: Admitting: Physical Therapy

## 2024-08-09 ENCOUNTER — Encounter: Admitting: Physical Therapy

## 2024-08-09 DIAGNOSIS — M5459 Other low back pain: Secondary | ICD-10-CM | POA: Diagnosis not present

## 2024-08-09 DIAGNOSIS — R2689 Other abnormalities of gait and mobility: Secondary | ICD-10-CM

## 2024-08-09 DIAGNOSIS — M533 Sacrococcygeal disorders, not elsewhere classified: Secondary | ICD-10-CM

## 2024-08-09 NOTE — Patient Instructions (Signed)
 Feet care :  Self -feet massage  ? ?Handshake : fingers between toes, moving ballmounds/toes back and forth several times while other hand anchors at arch. Do the same at the hind/mid foot.  ?Heel to toes upward to a letter Big Letter T strokes to spread ballmounds and toes, several times, pinch between webs of toes  ?Run finger tips along top of foot between long bones "comb between the bones"  ? ? ?Wiggle toes and spread them out when relaxing  ? ?

## 2024-08-09 NOTE — Therapy (Signed)
 OUTPATIENT PHYSICAL THERAPY TREATMENT    Patient Name: April Hoffman MRN: [id number] DOB:04-21-1981, 43 y.o., female Today's Date: 08/09/2024   PT End of Session - 08/09/24 1515     Visit Number 12    Number of Visits 19    Date for Recertification  09/19/24    PT Start Time 1510    PT Stop Time 1550    PT Time Calculation (min) 40 min    Activity Tolerance Patient tolerated treatment well;No increased pain    Behavior During Therapy [facility] for tasks assessed/performed           No past medical history on file. Past Surgical History:  Procedure Laterality Date   GUM SURGERY     WISDOM TOOTH EXTRACTION     Patient Active Problem List   Diagnosis Date Noted   B12 deficiency 05/25/2021   GAD (generalized anxiety disorder) 05/25/2021   Major depressive disorder, recurrent episode, moderate with mixed features (HCC) 05/25/2021   Panic attacks 05/25/2021   Gastroesophageal reflux disease without esophagitis 02/03/2021   Chest pain at rest 02/03/2021   Dysphagia 02/03/2021   Anxiety 02/03/2021   Current mild episode of major depressive disorder without prior episode 08/22/2019    PCP: Efraim   REFERRING PROVIDER: Tanda MART DIAG:  M62.89 (ICD-10-CM) - Pelvic floor tension  N94.10 (ICD-10-CM) - Dyspareunia in female    Rationale for Evaluation and Treatment   THERehabilitationRAPY DIAG:  Other low back pain  Sacrococcygeal disorders, not elsewhere classified  Other abnormalities of gait and mobility  ONSET DATE:   SUBJECTIVE:           SUBJECTIVE STATEMENT TODAY:   Pt noticed tow nights after last session two weeks ago, pt was sleep ingo n R side with pillow between knees and woke up with radiating pain down the anterior thigh and than  back of calf. Pt walked the technique of higher knees and lowered the feet with ballmounds and it calmed the radiating pain down. Today pt feels tightness along L band of back mm. But both sides feel grumpy.     Pt would like to learn the feet massage today   SUBJECTIVE STATEMENT ON EVAL 04/25/24 :  1 ) B LBP  : at its worst 7/10 after sitting 2 hours, Pain now only is located across the back and hips,  When it radiates to the top upper posterior thigh occurs when she has been sitting   Teaches yoga 1 class a week, 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  Pt read an article that indicated  Power peeing with using her abdominal muscles to pee and the need to stop doing that. Pt realized she had been doing that for a long time.  Pt reports she finds it difficult to relax her pelvic floor to pee.       3)  SUI :  with sneezing . New in the last 2 years.  Pt does not  wear pads.    4)  incomplete emptying urine : pt has to return to pee again after the first time. This occurs 50% of the time    5)  Loose stools,   type 4 occurring once a week,  Type 6-7 occurs every time,  BMs occur 3 x a day . Pt has struggled with BMs her whole life.  Pt used to go between constipation and diarrhea  a lot.    Daily fluid : water not enough 42 fl oz .  32 fl oz coffee, no juice, sodas ,   6) pain with sexual intercourse  -Did pelvic floor therapy 10 years ago with 3 months of session, therapist mostly focused on manual Tx internally and use of dilators. Pt did not enjoy using the dilators.    PERTINENT HISTORY:  R sciatic leg pain and loss of sensation lasted Feb-April 2024 , Chiropractic T x helped to regain sensation. Pain now only is located across the back and hips,  When it radiates to the top upper posterior thigh occurs when she has been sitting for 9 hrs/ day at a conference.    PAIN:  Are you having pain? Yes: see above   PRECAUTIONS: None  WEIGHT BEARING RESTRICTIONS: No  FALLS:  Has patient fallen in last 6 months?  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   PLOF: pain with sexual intercourse worsened as she got older    PATIENT GOALS:     OBJECTIVE:      [facility] PT Assessment - 08/09/24 1751       Coordination   Coordination and Movement Description les cues for deep core level 1-2      Palpation   Spinal mobility levelled pelvis and shoulders  less upper trap overuse    Palpation comment tightness along paraspinals, occiput B,          [facility] Adult PT Treatment/Exercise - 08/09/24 1752       Neuro Re-ed    Neuro Re-ed Details  cued for feet massage to minimize hypomobility      Manual Therapy   Manual therapy comments distraction at occiput, gentle manual Tx to miinimize paraspinal mm tightness              See pt instruction section    ASSESSMENT:  CLINICAL IMPRESSION:  Pt has met 3 /10 goals and progressing well towards remaining goals:  Improvements across past 9 visits include:  Levelled shoulders and pelvic girdle and improved sideflexion without pinching LBP  Increased propioception in standing posture with less hyperextended knees, more co-activation of transverse arches, and decreased upper trap overuse /chest breathing and more diaphragmatic breathing to optimize IAP and relaxation / less body tensions Improved reciporcal gait and increased gait speed  Pt was able to lift and move boxes during her move without the weakness and debilitating radiating LBP that she had with the first episode of LBP Feb 2024 and second relapse in June 2024.  Urinary improvements: Frequency decreased from 5 x every 2 hours  to 3 x every 2 hours.  Leakage with sneezing is improving  B LBP improvements: Pt is able to sit for 2 hours with decreased pain level from 7/10 to 3/10 level pain .  No relapse of sciatic pain since starting pelvic PT  Pt went to a more difficult yoga class and only felt sore, no LBP nor sciatica.   Pt demo'd  increased sideflexion ROM without LBP             Pelvic pain    10-15% less pain with sexual intercourse    Upcoming sessions will address difficulty with emptying urine, stool consistency, decrease bowel frequency, and further decreasing pelvic pain during sexual  intercourse     Today, applied manual Tx to minimize tightness along paraspinals and occiput mm today. Pt is maintaining levelled spine and pelvis.  Required less cues for deep core training today. Provided cues for technique and propioception of feet mobility with feet massage   Plan to continue address pt's collapsed arches/ feet hypomobility and help with LKC deficits to help address pelvic related goals and help pt return to yoga poses she once avoided due to LBP pain    Regional interdependent approaches will yield greater benefits in pt's POC.                   These improvements made today will help promote optimize IAP system for improved pelvic floor function, trunk stability, gait, balance, stabilization with mobility tasks.                 Plan to address pelvic floor issues once pelvis and spine are realigned to yield better outcomes.                                                      Pt benefits from skilled PT.    OBJECTIVE IMPAIRMENTS decreased activity tolerance, decreased coordination, decreased endurance, decreased mobility, difficulty walking, decreased ROM, decreased strength, decreased safety awareness, hypomobility, increased muscle spasms, impaired flexibility, improper body mechanics, postural dysfunction, and pain.   ACTIVITY LIMITATIONS  self-care,  driving and sitting    PARTICIPATION LIMITATIONS:  community  activities    PERSONAL FACTORS   affecting patient's functional outcome:    REHAB POTENTIAL: Good   CLINICAL DECISION MAKING: Evolving/moderate complexity   EVALUATION COMPLEXITY: Moderate    PATIENT EDUCATION:    Education details: Showed pt anatomy images. Explained muscles  attachments/ connection, physiology of deep core system/ spinal- thoracic-pelvis-lower kinetic chain as they relate to pt's presentation, Sx, and past Hx. Explained what and how these areas of deficits need to be restored to balance and function    See Therapeutic activity / neuromuscular re-education section  Answered pt's questions.   Person educated: Patient Education method: Explanation, Demonstration, Tactile cues, Verbal cues, and Handouts Education comprehension: verbalized understanding, returned demonstration, verbal cues required, tactile cues required, and needs further education     PLAN: PT FREQUENCY: 1x/week   PT DURATION: 10 weeks   PLANNED INTERVENTIONS:   Gait training;Stair training;Functional mobility training;DME Instruction;Therapeutic activities;Therapeutic exercise;Balance training;Neuromuscular re-education;Patient/family education;Vestibular;Visual/perceptual remediation/compensation;Passive range of motion;Moist Heat;Cryotherapy;Traction;Canalith Repostioning;Joint Manipulations;Manual lymph drainage;Manual techniques;Scar mobilization;Energy conservation;Dry needling;ADLs/Self Care Home Management;Biofeedback;Electrical Stimulation;Taping    PLAN FOR NEXT SESSION: See clinical impression for plan     GOALS: Goals reviewed with patient? Yes  SHORT TERM GOALS: Target date: 05/23/2024    Pt will demo IND with HEP                    Baseline: Not IND            Goal status: INITIAL   LONG TERM GOALS: Target date: 09/19/2024      1.Pt will demo proper deep core coordination without chest breathing and optimal excursion of diaphragm/pelvic floor in order to promote spinal stability and pelvic floor function to minimize SUI and dyspareunia  Baseline: dyscoordination Goal status: Ongoing   2.  Pt will demo proper body mechanics in against gravity tasks and ADLs  work tasks,  fitness ( modified yoga and pilate practice)   to minimize straining pelvic  floor / back    Baseline: not IND, improper form that places strain on pelvic floor  Goal status: Ongoing    3. Pt will demo increased gait speed > 1.6  m/s with reciprocal gait pattern, longer stride length  in order to ambulate safely in community and return to fitness routine  Baseline: 1.44 m/s, decreased stance on R, R trunk lean,  limited thoracic rotation posterior,  R anterior pelvic rotation  Goal status:  MET  ( 1.6 m/s reciprocal gait, armswings)    4. Pt will demo levelled pelvic girdle and shoulder height in order to progress to deep core strengthening HEP and restore mobility at spine, pelvis, gait, posture minimize falls, and improve balance  Baseline: R shoulder/ R iliac crest lowered,   hypomobility at C/T junction with small dowagers  Goal status: MET    5. Pt will report no LBP with rotation/ sideflexion and regain sideflexion bilaterally in order to perform ADLs  Baseline: L sideflexion / Rotation causing pinching at L PSIS  localized pain  4/10 rotation, 3/10 pain with sideflexion  ,  L c 50cm  digit III to the floor at the onset of pain,   R 44 cm   07/11/24: sideflexion   46 cm on L without LBP , R 43 cm,   rotation: minor pinching without radiating pain  Goal status:  Ongoing   6. Pt will report no longer needing to return to pee a second time and able to empty urine completely 100% of the time to minimize risks for UTI and return to work tasks without interruptions Baseline:pt has to return to pee again after the first time. This occurs 50% of the time   Goal status:  MET    7. 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 Pt reports type 4 consistency > 50% of the time per day instead of once a week,  Type 6-7 across 50% of the time  Baseline:ype 4 occurring once a week,  Type  6-7 occurs every time,  BMs occur 3 x a day . Pt has struggled with BMs her whole life.  Pt used to go between constipation and diarrhea a lot.   Goal status: Ongoing   9. Pt will demo increased endurance > 30sec for cervical endurance mm in order to to return to [place] and other yoga practices  Baseline:cervical endurance test : 18.26 sec at point of R shoulder tightness , poor cervicoscapular retraction, overuse of upper trap  Goal status:  Ongoing   10. Pt will report no longer need to return to chiropractic treatments across 3 weeks and to be able to perform yoga poses with backbends with modifications  Goal status:  NEW     Pia Lupe Plump, PT 08/09/2024, 3:18 PM

## 2024-08-16 ENCOUNTER — Encounter: Admitting: Physical Therapy

## 2024-08-21 ENCOUNTER — Ambulatory Visit: Admitting: Physical Therapy

## 2024-08-21 DIAGNOSIS — M5459 Other low back pain: Secondary | ICD-10-CM | POA: Diagnosis not present

## 2024-08-21 DIAGNOSIS — R2689 Other abnormalities of gait and mobility: Secondary | ICD-10-CM

## 2024-08-21 DIAGNOSIS — M533 Sacrococcygeal disorders, not elsewhere classified: Secondary | ICD-10-CM

## 2024-08-21 NOTE — Patient Instructions (Signed)
 Daily   Motion is lotion  - good morning and evening     On your side - winging and brushing   Pillow between knees and behind back     On your back with towel roll under the neck  - neck 6 directions - angel wings - ( dragging arms)  - ZigZag stretch scoot hips to one side and rock knees to opposite, arms by side ,palms up      On your other side - winging and brushing   Pillow between knees and behind back      Deep core level 1-2                April Hoffman pose ricking  Hoffman pose 3 way Table top shoulder / trunk turn    ____________________________________________  Every other day:  Multidifis ( door) seated   ( blue)   A to W on your back  ( blue)   Replace feet slides with heel press -minisquat 10 reps x 3   Dolphin plank by the wall  15reps   ___________________________________________   Dolphin squats on wall   Fingers interlaced, elbows shoulder width apart, forearms in a triangle pressing against the wall Mini squat position   Inhale, exhale chin tucked, shoulders lengthen down from ears, as you rise up not locking knees, hairline brushes by thumbs ( like dolphin snout diving up out of the water  15 reps.  Make sure to not let the front ribs flare out, ribs over the pelvis aligned to not have a swayed back  Pressure through ballmounds to rise up, and dont lock the knees   __   Strengthening feet arches:     Heel raises - heels together, minisquat Hold onto counter   Minisquat motion, trunk bent , gaze onto floor like you are looking at your reflection over a lake/pond,  Knees bent pointed out like a v , navel ( center of mass) more forward  Heels together as you lift, pointed out like a v  KNEES ARE ALIGNED BEHIND THE TOES TO MINIMIZE STRAIN ON THE KNEES your  navel ( center of mass) more forward to a avoid dropping down fast and rocking more weight back onto heels , keep heels pressing against each other the whole  time   30 reps  ___

## 2024-08-21 NOTE — Therapy (Signed)
 OUTPATIENT PHYSICAL THERAPY TREATMENT    Patient Name: Benelli Winther MRN: [id number] DOB:1981/05/04, 43 y.o., female Today's Date: 08/21/2024   PT End of Session - 08/21/24 1511     Visit Number 13    Number of Visits 19    Date for Recertification  09/19/24    PT Start Time 1505    PT Stop Time 1545    PT Time Calculation (min) 40 min    Activity Tolerance Patient tolerated treatment well;No increased pain    Behavior During Therapy [facility] for tasks assessed/performed           No past medical history on file. Past Surgical History:  Procedure Laterality Date   GUM SURGERY     WISDOM TOOTH EXTRACTION     Patient Active Problem List   Diagnosis Date Noted   B12 deficiency 05/25/2021   GAD (generalized anxiety disorder) 05/25/2021   Major depressive disorder, recurrent episode, moderate with mixed features (HCC) 05/25/2021   Panic attacks 05/25/2021   Gastroesophageal reflux disease without esophagitis 02/03/2021   Chest pain at rest 02/03/2021   Dysphagia 02/03/2021   Anxiety 02/03/2021   Current mild episode of major depressive disorder without prior episode 08/22/2019    PCP: Efraim   REFERRING PROVIDER: Tanda MART DIAG:  M62.89 (ICD-10-CM) - Pelvic floor tension  N94.10 (ICD-10-CM) - Dyspareunia in female    Rationale for Evaluation and Treatment   THERehabilitationRAPY DIAG:  Other low back pain  Sacrococcygeal disorders, not elsewhere classified  Other abnormalities of gait and mobility  ONSET DATE:   SUBJECTIVE:           SUBJECTIVE STATEMENT TODAY:   Pt felt so relaxed after last session. Pt had a covid shot which triggered B radiating LBP and aches in the low back, hips, and legs. These Sx went away the next day.  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  Pt read an article that indicated  Power peeing with using her abdominal muscles to pee and the need to stop doing that. Pt realized she had been doing that for a long time.  Pt reports she finds it difficult to relax her pelvic floor to pee.       3)  SUI :  with sneezing . New in the last 2 years.  Pt does not  wear pads.    4)  incomplete emptying urine : pt has to return to pee again after the first time. This occurs 50% of the time    5)  Loose stools,   type 4 occurring once a week,  Type 6-7 occurs every time,  BMs occur 3 x a day . Pt has struggled with BMs her whole life.  Pt used to go between constipation and diarrhea a lot.    Daily fluid : water not enough 42 fl oz .  32 fl oz coffee, no juice, sodas ,   6) pain with sexual intercourse  -Did pelvic floor therapy  10 years ago with 3 months of session, therapist mostly focused on manual Tx internally and use of dilators. Pt did not enjoy using the dilators.    PERTINENT HISTORY:  R sciatic leg pain and loss of sensation lasted Feb-April 2024 , Chiropractic T x helped to regain sensation. Pain now only is located across the back and hips,  When it radiates to the top upper posterior thigh occurs when she has been sitting for 9 hrs/ day at a conference.    PAIN:  Are you having pain? Yes: see above   PRECAUTIONS: None  WEIGHT BEARING RESTRICTIONS: No  FALLS:  Has patient fallen in last 6 months?  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   PLOF: pain with sexual intercourse worsened as she got older    PATIENT GOALS:      OBJECTIVE:     [facility] PT Assessment - 08/21/24 1602       Other:   Other/Comments hip hinge with heels pressed exercise with difficulty on pelvic propioception, overuse of extensor mm,      Palpation   Spinal mobility levelled pelvis and shoulders  less upper trap overuse          [facility] Adult PT Treatment/Exercise - 08/21/24 1607       Self-Care   Other Self-Care Comments  consolidated HEP as pt is getting busier with teaching college classes , plan to address yoga principles at next sessions      Neuro Re-ed    Neuro Re-ed Details  cued for propioception for LKC< pelvis in new HEP to improve feet arches, less extensor and posterior pelvic tilt for movement of pelvis                    See pt instruction section    ASSESSMENT:  CLINICAL IMPRESSION:  Pt has met 3 /10 goals and progressing well towards remaining goals:  Improvements across past 9 visits include:  Levelled shoulders and pelvic girdle and improved sideflexion without pinching LBP  Increased propioception in standing posture with less hyperextended knees, more co-activation of transverse arches, and decreased upper trap overuse /chest breathing and more diaphragmatic breathing to optimize IAP and relaxation / less body tensions Improved reciporcal gait and increased gait speed  Pt was able to lift and move boxes during her move without the weakness and debilitating radiating LBP that she had with the first episode of LBP Feb 2024 and second relapse in June 2024.  Urinary improvements: Frequency decreased from 5 x every 2 hours  to 3 x every 2 hours.  Leakage with sneezing is improving  B LBP improvements: Pt is able to sit for 2 hours with decreased pain level from 7/10 to 3/10 level pain .  No relapse of sciatic pain since starting pelvic PT  Pt went to a more difficult yoga class and only felt sore, no LBP nor sciatica.   Pt demo'd increased sideflexion ROM without LBP              Pelvic pain    10-15% less pain with sexual intercourse    Upcoming sessions will address difficulty with emptying urine, stool consistency, decrease bowel frequency, and further decreasing pelvic pain during sexual intercourse  Today,  consolidated HEP as pt is getting busier with teaching college classes , plan to address yoga principles at next sessions .  Cued for propioception for LKC, pelvis in new HEP to improve feet arches, less extensor and posterior pelvic tilt for movement of pelvis                                                Plan to continue address pt's collapsed arches/ feet hypomobility and help with LKC deficits to help address pelvic related goals and help pt return to yoga poses she once avoided due to LBP pain                       Plan to address yoga principles for [facility] and deep core activation to minimize relapse of spinal misalignment, overuse of global mm and LKC deficits.                Regional interdependent approaches will yield greater benefits in pt's POC.                   These improvements made today will help promote optimize IAP system for improved pelvic floor function, trunk stability, gait, balance, stabilization with mobility tasks.                 Plan to address pelvic floor issues once pelvis and spine are realigned to yield better outcomes.                                                      Pt benefits from skilled PT.    OBJECTIVE IMPAIRMENTS decreased activity tolerance, decreased coordination, decreased endurance, decreased mobility, difficulty walking, decreased ROM, decreased strength, decreased safety awareness, hypomobility, increased muscle spasms, impaired flexibility, improper body mechanics, postural dysfunction, and pain.   ACTIVITY LIMITATIONS  self-care,  driving and sitting    PARTICIPATION LIMITATIONS:  community  activities    PERSONAL FACTORS   affecting patient's functional outcome:    REHAB POTENTIAL: Good    CLINICAL DECISION MAKING: Evolving/moderate complexity   EVALUATION COMPLEXITY: Moderate    PATIENT EDUCATION:    Education details: Showed pt anatomy images. Explained muscles attachments/ connection, physiology of deep core system/ spinal- thoracic-pelvis-lower kinetic chain as they relate to pt's presentation, Sx, and past Hx. Explained what and how these areas of deficits need to be restored to balance and function    See Therapeutic activity / neuromuscular re-education section  Answered pt's questions.   Person educated: Patient Education method: Explanation, Demonstration, Tactile cues, Verbal cues, and Handouts Education comprehension: verbalized understanding, returned demonstration, verbal cues required, tactile cues required, and needs further education     PLAN: PT FREQUENCY: 1x/week   PT DURATION: 10 weeks   PLANNED INTERVENTIONS:   Gait training;Stair training;Functional mobility training;DME Instruction;Therapeutic activities;Therapeutic exercise;Balance training;Neuromuscular re-education;Patient/family education;Vestibular;Visual/perceptual remediation/compensation;Passive range of motion;Moist Heat;Cryotherapy;Traction;Canalith Repostioning;Joint Manipulations;Manual lymph drainage;Manual techniques;Scar mobilization;Energy conservation;Dry needling;ADLs/Self Care Home Management;Biofeedback;Electrical Stimulation;Taping    PLAN FOR NEXT SESSION: See clinical impression for plan     GOALS: Goals reviewed with patient? Yes  SHORT TERM GOALS: Target date: 05/23/2024    Pt will demo IND with HEP  Baseline: Not IND            Goal status: INITIAL   LONG TERM GOALS: Target date: 09/19/2024      1.Pt will demo proper deep core coordination without chest breathing and optimal excursion of diaphragm/pelvic floor in order to promote spinal stability and pelvic floor function to minimize SUI and dyspareunia  Baseline: dyscoordination Goal  status: Ongoing   2.  Pt will demo proper body mechanics in against gravity tasks and ADLs  work tasks, fitness ( modified yoga and pilate practice)   to minimize straining pelvic floor / back    Baseline: not IND, improper form that places strain on pelvic floor  Goal status: Ongoing    3. Pt will demo increased gait speed > 1.6  m/s with reciprocal gait pattern, longer stride length  in order to ambulate safely in community and return to fitness routine  Baseline: 1.44 m/s, decreased stance on R, R trunk lean,  limited thoracic rotation posterior,  R anterior pelvic rotation  Goal status:  MET  ( 1.6 m/s reciprocal gait, armswings)    4. Pt will demo levelled pelvic girdle and shoulder height in order to progress to deep core strengthening HEP and restore mobility at spine, pelvis, gait, posture minimize falls, and improve balance  Baseline: R shoulder/ R iliac crest lowered,   hypomobility at C/T junction with small dowagers  Goal status: MET    5. Pt will report no LBP with rotation/ sideflexion and regain sideflexion bilaterally in order to perform ADLs  Baseline: L sideflexion / Rotation causing pinching at L PSIS  localized pain  4/10 rotation, 3/10 pain with sideflexion  ,  L c 50cm  digit III to the floor at the onset of pain,   R 44 cm   07/11/24: sideflexion   46 cm on L without LBP , R 43 cm,   rotation: minor pinching without radiating pain  Goal status:  Ongoing   6. Pt will report no longer needing to return to pee a second time and able to empty urine completely 100% of the time to minimize risks for UTI and return to work tasks without interruptions Baseline:pt has to return to pee again after the first time. This occurs 50% of the time   Goal status:  MET    7. 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 Pt reports type 4 consistency > 50% of the time per day instead of once a week,  Type 6-7 across 50% of the time  Baseline:ype 4 occurring once a week,  Type 6-7 occurs every time,  BMs occur 3 x a day . Pt has struggled with BMs her whole life.  Pt used to go between constipation and diarrhea a lot.   Goal status: Ongoing   9. Pt will demo increased endurance > 30sec for cervical endurance mm in order to to return to [place] and other yoga practices  Baseline:cervical endurance test : 18.26 sec at point of R shoulder tightness , poor cervicoscapular retraction, overuse of upper trap  Goal status:  Ongoing   10. Pt will report no longer need to return to chiropractic treatments across 3 weeks and to be able to perform yoga poses with backbends with  modifications  Goal status:  NEW     Pia Lupe Plump, PT 08/21/2024, 3:11 PM

## 2024-08-22 ENCOUNTER — Encounter: Admitting: Physical Therapy

## 2024-09-04 ENCOUNTER — Ambulatory Visit: Attending: Certified Nurse Midwife | Admitting: Physical Therapy

## 2024-09-04 DIAGNOSIS — R2689 Other abnormalities of gait and mobility: Secondary | ICD-10-CM | POA: Diagnosis present

## 2024-09-04 DIAGNOSIS — M5459 Other low back pain: Secondary | ICD-10-CM | POA: Insufficient documentation

## 2024-09-04 DIAGNOSIS — M533 Sacrococcygeal disorders, not elsewhere classified: Secondary | ICD-10-CM | POA: Insufficient documentation

## 2024-09-04 NOTE — Therapy (Addendum)
 OUTPATIENT PHYSICAL THERAPY TREATMENT  / Discharge Summary across 14 visits    Patient Name: April Hoffman MRN: [id number] DOB:October 24, 1981, 43 y.o., female Today's Date: 09/04/2024   PT End of Session - 09/04/24 1512     Visit Number 14    Number of Visits 19    Date for Recertification  09/19/24    PT Start Time 1509    PT Stop Time 1549    PT Time Calculation (min) 40 min    Activity Tolerance Patient tolerated treatment well;No increased pain    Behavior During Therapy [facility] for tasks assessed/performed           No past medical history on file. Past Surgical History:  Procedure Laterality Date   GUM SURGERY     WISDOM TOOTH EXTRACTION     Patient Active Problem List   Diagnosis Date Noted   B12 deficiency 05/25/2021   GAD (generalized anxiety disorder) 05/25/2021   Major depressive disorder, recurrent episode, moderate with mixed features (HCC) 05/25/2021   Panic attacks 05/25/2021   Gastroesophageal reflux disease without esophagitis 02/03/2021   Chest pain at rest 02/03/2021   Dysphagia 02/03/2021   Anxiety 02/03/2021   Current mild episode of major depressive disorder without prior episode 08/22/2019    PCP: Efraim   REFERRING PROVIDER: Tanda MART DIAG:  M62.89 (ICD-10-CM) - Pelvic floor tension  N94.10 (ICD-10-CM) - Dyspareunia in female    Rationale for Evaluation and Treatment   THERehabilitationRAPY DIAG:  Other low back pain  Sacrococcygeal disorders, not elsewhere classified  Other abnormalities of gait and mobility  ONSET DATE:   SUBJECTIVE:           SUBJECTIVE STATEMENT TODAY:   Pt continues to have no LBP.   Pt is not having to return to urinate a second time as often, from 50% of the time to 20% of the time   Pt is still having loose stools.  Pt reports pain with sexual intercourse occurs 30% less.   SUBJECTIVE STATEMENT ON EVAL 04/25/24 :  1 ) B LBP  : at its worst 7/10 after sitting 2 hours, Pain now only is  located across the back and hips,  When it radiates to the top upper posterior thigh occurs when she has been sitting   Teaches yoga 1 class a week, 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  Pt read an article that indicated  Power peeing with using her abdominal muscles to pee and the need to stop doing that. Pt realized she had been doing that for a long time.  Pt reports she finds it difficult to relax her pelvic floor to pee.       3)  SUI :  with sneezing . New in the last 2 years.  Pt does not  wear pads.    4)  incomplete emptying urine : pt has to return to pee again after the first time. This occurs 50% of the time    5)  Loose stools,   type 4 occurring once a week,  Type 6-7 occurs every time,  BMs occur 3 x a day . Pt has struggled with BMs her whole life.  Pt used to go between constipation and diarrhea a lot.    Daily fluid : water not enough 42 fl oz .  32 fl oz coffee, no juice, sodas ,   6) pain  with sexual intercourse  -Did pelvic floor therapy 10 years ago with 3 months of session, therapist mostly focused on manual Tx internally and use of dilators. Pt did not enjoy using the dilators.    PERTINENT HISTORY:  R sciatic leg pain and loss of sensation lasted Feb-April 2024 , Chiropractic T x helped to regain sensation. Pain now only is located across the back and hips,  When it radiates to the top upper posterior thigh occurs when she has been sitting for 9 hrs/ day at a conference.    PAIN:  Are you having pain? Yes: see above   PRECAUTIONS: None  WEIGHT BEARING RESTRICTIONS: No  FALLS:  Has patient fallen in last 6 months?  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   PLOF: pain with sexual intercourse worsened as she got older    PATIENT  GOALS:     OBJECTIVE:      [facility] PT Assessment - 09/04/24 1530       Observation/Other Assessments   Observations ( 09/04/24:  37 sec  cervical endurance test   Warrior pose I, II,: arch collapse, hip IR, poor knee alignment   Tree pose: poor pelvic propioception and stability          [facility] Adult PT Treatment/Exercise - 09/04/24 1709       Self-Care   Other Self-Care Comments  reassessed goals       Therapeutic Activites    Other Therapeutic Activities explained LKC principles with standing yoga poses to integrate back to yoga classess, explained the kinetic chain for less tightness of pelvic floor, collapsed arches      Neuro Re-ed    Neuro Re-ed Details  cued for propioception of LKC for lifted arches, kneea lignment and pelvic girdle stability in yoga warrior II,  I,  tree pose,           See pt instruction section    ASSESSMENT:  CLINICAL IMPRESSION: Pt met 8/10 goals and would like to self discharge.    Improvements include   Levelled shoulders and pelvic girdle and improved sideflexion without pinching LBP  Increased propioception in standing posture with less hyperextended knees, more co-activation of transverse arches, and decreased upper trap overuse /chest breathing and more diaphragmatic breathing to optimize IAP and relaxation / less body tensions Improved reciporcal gait and increased gait speed  Pt was able to lift and move boxes during her move without the weakness and debilitating radiating LBP that she had with the first episode of LBP Feb 2024 and second relapse in June 2024.  Urinary improvements: Frequency decreased from 5 x every 2 hours  to 3 x every 2 hours.  Leakage with sneezing is improving Pt is not having to return to urinate a second time as often, from 50% of the time to 20% of the time  ( today 09/04/24)   B LBP improvements: Pt is able to sit for 2 hours with decreased pain level from 7/10 to 3/10 level pain .  No relapse  of sciatic pain since starting pelvic PT  Pt went to a more difficult yoga class and only felt sore, no LBP nor sciatica.   Pt demo'd increased sideflexion ROM without LBP             Pelvic pain  30%  less pain with sexual intercourse ( today 09/04/24)                Today,                          Addressed pt's collapsed arches/ feet hypomobility and help with LKC deficits with standing yoga postures  to help pt return to yoga practice without relapse of Sx and musculoskeletal misalignment.                       Cued for propioception of LKC for lifted arches, kneea lignment and pelvic girdle stability in yoga warrior II,  I,  tree pose. Explained the kinetic chain for less tightness of pelvic floor, collapsed arches. Pt demo'd correctly post Tx                         Continue to instruct yoga principles for [facility] and deep core activation to minimize relapse of spinal misalignment, overuse of global mm and LKC deficits.  Plan to focus on back bends and provide modifications to minimize back pain               Regional interdependent approaches will yield greater benefits in pt's POC.                  These improvements  helped to optimize IAP system for improved pelvic floor function, trunk stability, gait, balance, stabilization with mobility tasks.                    Discussed upcoming sessions will address stool consistency, decrease bowel frequency, and further decreasing pelvic pain during sexual intercourse but pt is ready for d/c                  OBJECTIVE IMPAIRMENTS decreased activity tolerance, decreased coordination, decreased endurance, decreased mobility, difficulty walking, decreased ROM, decreased strength, decreased safety awareness, hypomobility, increased muscle spasms, impaired flexibility, improper body mechanics, postural dysfunction, and pain.   ACTIVITY LIMITATIONS  self-care,  driving and sitting    PARTICIPATION LIMITATIONS:  community  activities     PERSONAL FACTORS   affecting patient's functional outcome:    REHAB POTENTIAL: Good   CLINICAL DECISION MAKING: Evolving/moderate complexity   EVALUATION COMPLEXITY: Moderate    PATIENT EDUCATION:    Education details: Showed pt anatomy images. Explained muscles attachments/ connection, physiology of deep core system/ spinal- thoracic-pelvis-lower kinetic chain as they relate to pt's presentation, Sx, and past Hx. Explained what and how these areas of deficits need to be restored to balance and function    See Therapeutic activity / neuromuscular re-education section  Answered pt's questions.   Person educated: Patient Education method: Explanation, Demonstration, Tactile cues, Verbal cues, and Handouts Education comprehension: verbalized understanding, returned demonstration, verbal cues required, tactile cues required, and needs further education     PLAN: PT FREQUENCY: 1x/week   PT DURATION: 10 weeks   PLANNED INTERVENTIONS:   Gait training;Stair training;Functional mobility training;DME Instruction;Therapeutic activities;Therapeutic exercise;Balance training;Neuromuscular re-education;Patient/family education;Vestibular;Visual/perceptual remediation/compensation;Passive range of motion;Moist Heat;Cryotherapy;Traction;Canalith Repostioning;Joint Manipulations;Manual lymph drainage;Manual techniques;Scar mobilization;Energy conservation;Dry needling;ADLs/Self Care Home Management;Biofeedback;Electrical Stimulation;Taping    PLAN FOR NEXT SESSION: See clinical impression for plan     GOALS: Goals reviewed with patient? Yes  SHORT TERM GOALS: Target date: 05/23/2024    Pt will demo IND with HEP  Baseline: Not IND            Goal status: INITIAL   LONG TERM GOALS: Target date: 09/19/2024      1.Pt will demo proper deep core coordination without chest breathing and optimal excursion of diaphragm/pelvic floor in order to promote spinal stability  and pelvic floor function to minimize SUI and dyspareunia  Baseline: dyscoordination Goal status:  MET   2.  Pt will demo proper body mechanics in against gravity tasks and ADLs  work tasks, fitness ( modified yoga and pilate practice)   to minimize straining pelvic floor / back    Baseline: not IND, improper form that places strain on pelvic floor  Goal status: MET   3. Pt will demo increased gait speed > 1.6  m/s with reciprocal gait pattern, longer stride length  in order to ambulate safely in community and return to fitness routine  Baseline: 1.44 m/s, decreased stance on R, R trunk lean,  limited thoracic rotation posterior,  R anterior pelvic rotation  Goal status:  MET  ( 1.6 m/s reciprocal gait, armswings)    4. Pt will demo levelled pelvic girdle and shoulder height in order to progress to deep core strengthening HEP and restore mobility at spine, pelvis, gait, posture minimize falls, and improve balance  Baseline: R shoulder/ R iliac crest lowered,   hypomobility at C/T junction with small dowagers  Goal status: MET    5. Pt will report no LBP with rotation/ sideflexion and regain sideflexion bilaterally in order to perform ADLs  Baseline: L sideflexion / Rotation causing pinching at L PSIS  localized pain  4/10 rotation, 3/10 pain with sideflexion  ,  L c 50cm  digit III to the floor at the onset of pain,   R 44 cm   07/11/24: sideflexion   46 cm on L without LBP , R 43 cm,   rotation: minor pinching without radiating pain  09/04/24:  46 cm on L without LBP , R 43 cm,  rotation no pain  Goal status: MET   6. Pt will report no longer needing to return to pee a second time and able to empty urine completely 100% of the time to minimize risks for UTI and return to work tasks without interruptions Baseline:pt has to return to pee again after the first time. This occurs 50% of the time   Goal status:  MET    7. 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 Pt reports type 4 consistency > 50% of the time per day instead of once a week,  Type 6-7 across 50% of the time  Baseline:ype 4 occurring once a week,  Type 6-7 occurs every time,  BMs occur 3 x a day . Pt has struggled with BMs her whole life.  Pt used to go between constipation and diarrhea a lot.   Goal status Not met   9. Pt will demo increased endurance > 30sec for cervical endurance mm in order to to return to [place] and other yoga practices  Baseline:cervical endurance test : 18.26 sec at point of R shoulder tightness , poor cervicoscapular retraction, overuse of upper trap  Goal status:  MET ( 09/04/24:  37 sec )    10. Pt will  be able to perform yoga poses with backbends with modifications  Goal status:  Not met     Pia Lupe Plump, PT 09/04/2024, 3:13 PM

## 2024-09-18 ENCOUNTER — Ambulatory Visit: Admitting: Physical Therapy

## 2024-10-02 ENCOUNTER — Ambulatory Visit: Admitting: Physical Therapy

## 2024-10-09 ENCOUNTER — Ambulatory Visit: Admitting: Physical Therapy

## 2024-10-16 ENCOUNTER — Ambulatory Visit: Admitting: Physical Therapy
